# Patient Record
Sex: Female | Born: 1960 | Race: White | Hispanic: No | Marital: Married | Smoking: Never smoker
Health system: Southern US, Community
[De-identification: ages and names within clinical notes are randomized; demographics above are authoritative.]

## PROBLEM LIST (undated history)

## (undated) DIAGNOSIS — F988 Other specified behavioral and emotional disorders with onset usually occurring in childhood and adolescence: Secondary | ICD-10-CM

## (undated) DIAGNOSIS — K5790 Diverticulosis of intestine, part unspecified, without perforation or abscess without bleeding: Secondary | ICD-10-CM

## (undated) HISTORY — PX: COLONOSCOPY: SHX174

## (undated) HISTORY — DX: Diverticulosis of intestine, part unspecified, without perforation or abscess without bleeding: K57.90

## (undated) HISTORY — DX: Other specified behavioral and emotional disorders with onset usually occurring in childhood and adolescence: F98.8

## (undated) HISTORY — PX: POLYPECTOMY: SHX149

---
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
## 1979-10-06 HISTORY — PX: FEMORAL HERNIA REPAIR: SUR1179

## 2007-09-14 ENCOUNTER — Ambulatory Visit: Payer: Self-pay | Admitting: Family Medicine

## 2007-09-14 DIAGNOSIS — F988 Other specified behavioral and emotional disorders with onset usually occurring in childhood and adolescence: Secondary | ICD-10-CM | POA: Insufficient documentation

## 2007-09-15 ENCOUNTER — Encounter: Payer: Self-pay | Admitting: Family Medicine

## 2007-10-17 ENCOUNTER — Ambulatory Visit: Payer: Self-pay | Admitting: Family Medicine

## 2007-10-18 ENCOUNTER — Encounter: Payer: Self-pay | Admitting: Family Medicine

## 2007-10-18 LAB — CONVERTED CEMR LAB
Albumin: 4.8 g/dL (ref 3.5–5.2)
CO2: 20 meq/L (ref 19–32)
Calcium: 9.8 mg/dL (ref 8.4–10.5)
Chloride: 107 meq/L (ref 96–112)
Glucose, Bld: 88 mg/dL (ref 70–99)
Potassium: 5 meq/L (ref 3.5–5.3)
Sodium: 141 meq/L (ref 135–145)
Total Protein: 7.4 g/dL (ref 6.0–8.3)

## 2007-11-17 ENCOUNTER — Telehealth: Payer: Self-pay | Admitting: Family Medicine

## 2007-12-15 ENCOUNTER — Telehealth: Payer: Self-pay | Admitting: Family Medicine

## 2008-01-18 ENCOUNTER — Telehealth: Payer: Self-pay | Admitting: Family Medicine

## 2008-02-21 ENCOUNTER — Telehealth: Payer: Self-pay | Admitting: Family Medicine

## 2008-03-27 ENCOUNTER — Telehealth: Payer: Self-pay | Admitting: Family Medicine

## 2008-04-25 ENCOUNTER — Telehealth: Payer: Self-pay | Admitting: Family Medicine

## 2008-05-28 ENCOUNTER — Ambulatory Visit: Payer: Self-pay | Admitting: Family Medicine

## 2008-06-28 ENCOUNTER — Telehealth: Payer: Self-pay | Admitting: Family Medicine

## 2008-08-06 ENCOUNTER — Telehealth: Payer: Self-pay | Admitting: Family Medicine

## 2008-09-05 ENCOUNTER — Telehealth: Payer: Self-pay | Admitting: Family Medicine

## 2008-10-15 ENCOUNTER — Telehealth: Payer: Self-pay | Admitting: Family Medicine

## 2008-11-15 ENCOUNTER — Telehealth: Payer: Self-pay | Admitting: Family Medicine

## 2008-11-16 ENCOUNTER — Ambulatory Visit: Payer: Self-pay | Admitting: Family Medicine

## 2008-12-18 ENCOUNTER — Telehealth: Payer: Self-pay | Admitting: Family Medicine

## 2009-02-26 ENCOUNTER — Telehealth: Payer: Self-pay | Admitting: Family Medicine

## 2009-04-03 ENCOUNTER — Telehealth: Payer: Self-pay | Admitting: Family Medicine

## 2009-05-22 ENCOUNTER — Telehealth: Payer: Self-pay | Admitting: Family Medicine

## 2009-07-11 ENCOUNTER — Other Ambulatory Visit: Admission: RE | Admit: 2009-07-11 | Discharge: 2009-07-11 | Payer: Self-pay | Admitting: Family Medicine

## 2009-07-11 ENCOUNTER — Encounter: Payer: Self-pay | Admitting: Family Medicine

## 2009-07-11 ENCOUNTER — Ambulatory Visit: Payer: Self-pay | Admitting: Family Medicine

## 2009-08-13 ENCOUNTER — Telehealth: Payer: Self-pay | Admitting: Family Medicine

## 2009-09-10 ENCOUNTER — Telehealth: Payer: Self-pay | Admitting: Family Medicine

## 2009-10-24 ENCOUNTER — Telehealth: Payer: Self-pay | Admitting: Family Medicine

## 2010-01-02 ENCOUNTER — Telehealth: Payer: Self-pay | Admitting: Family Medicine

## 2010-02-10 ENCOUNTER — Telehealth: Payer: Self-pay | Admitting: Family Medicine

## 2010-04-24 ENCOUNTER — Telehealth: Payer: Self-pay | Admitting: Family Medicine

## 2010-05-17 ENCOUNTER — Encounter: Payer: Self-pay | Admitting: Family Medicine

## 2010-06-11 ENCOUNTER — Telehealth: Payer: Self-pay | Admitting: Family Medicine

## 2010-08-25 ENCOUNTER — Ambulatory Visit: Payer: Self-pay | Admitting: Family Medicine

## 2010-08-25 ENCOUNTER — Other Ambulatory Visit: Admission: RE | Admit: 2010-08-25 | Discharge: 2010-08-25 | Payer: Self-pay | Admitting: Family Medicine

## 2010-08-25 DIAGNOSIS — R635 Abnormal weight gain: Secondary | ICD-10-CM | POA: Insufficient documentation

## 2010-08-26 LAB — CONVERTED CEMR LAB
ALT: 22 units/L (ref 0–35)
AST: 25 units/L (ref 0–37)
CO2: 27 meq/L (ref 19–32)
Calcium: 9.4 mg/dL (ref 8.4–10.5)
Chloride: 106 meq/L (ref 96–112)
Cholesterol: 174 mg/dL (ref 0–200)
Sodium: 140 meq/L (ref 135–145)
TSH: 3.179 microintl units/mL (ref 0.350–4.500)
Total Bilirubin: 0.5 mg/dL (ref 0.3–1.2)
Total CHOL/HDL Ratio: 2.8
Total Protein: 6.5 g/dL (ref 6.0–8.3)
VLDL: 11 mg/dL (ref 0–40)

## 2010-08-27 LAB — CONVERTED CEMR LAB: Pap Smear: NEGATIVE

## 2010-09-22 ENCOUNTER — Telehealth: Payer: Self-pay | Admitting: Family Medicine

## 2010-10-28 ENCOUNTER — Telehealth: Payer: Self-pay | Admitting: Family Medicine

## 2010-11-02 LAB — CONVERTED CEMR LAB
AST: 17 units/L (ref 0–37)
Albumin: 4.5 g/dL (ref 3.5–5.2)
Alkaline Phosphatase: 48 units/L (ref 39–117)
LDL Cholesterol: 92 mg/dL (ref 0–99)
Potassium: 4.3 meq/L (ref 3.5–5.3)
Sodium: 142 meq/L (ref 135–145)
Total Bilirubin: 0.5 mg/dL (ref 0.3–1.2)
Total Protein: 6.9 g/dL (ref 6.0–8.3)
VLDL: 10 mg/dL (ref 0–40)

## 2010-11-06 NOTE — Progress Notes (Signed)
Summary: Adderall refill  Phone Note Refill Request   Refills Requested: Medication #1:  ADDERALL XR 30 MG XR24H-CAP 1 tab by mouth qAM  Medication #2:  ADDERALL 10 MG TABS Take 1 tablet by mouth once a day. Initial call taken by: Payton Spark CMA,  October 24, 2009 10:07 AM    Prescriptions: ADDERALL 10 MG TABS (AMPHETAMINE-DEXTROAMPHETAMINE) Take 1 tablet by mouth once a day  #30 x 0   Entered and Authorized by:   Seymour Bars DO   Signed by:   Seymour Bars DO on 10/24/2009   Method used:   Print then Give to Patient   RxID:   [id number] ADDERALL XR 30 MG XR24H-CAP (AMPHETAMINE-DEXTROAMPHETAMINE) 1 tab by mouth qAM  #30 x 0   Entered and Authorized by:   Seymour Bars DO   Signed by:   Seymour Bars DO on 10/24/2009   Method used:   Print then Give to Patient   RxID:   [id number]

## 2010-11-06 NOTE — Progress Notes (Signed)
Summary: Adderall refill  Phone Note Refill Request Message from:  Patient on September 22, 2010 3:05 PM  Refills Requested: Medication #1:  ADDERALL XR 30 MG XR24H-CAP 1 tab by mouth qAM   Supply Requested: 1 month  Medication #2:  ADDERALL 10 MG TABS Take 1 tablet by mouth once a day.   Supply Requested: 1 month  Method Requested: Pick up at Office Initial call taken by: Kathlene November LPN,  September 22, 2010 3:05 PM    Prescriptions: ADDERALL 10 MG TABS (AMPHETAMINE-DEXTROAMPHETAMINE) Take 1 tablet by mouth once a day  #30 x 0   Entered and Authorized by:   Seymour Bars DO   Signed by:   Seymour Bars DO on 09/22/2010   Method used:   Print then Give to Patient   RxID:   [id number] ADDERALL XR 30 MG XR24H-CAP (AMPHETAMINE-DEXTROAMPHETAMINE) 1 tab by mouth qAM  #30 x 0   Entered and Authorized by:   Seymour Bars DO   Signed by:   Seymour Bars DO on 09/22/2010   Method used:   Print then Give to Patient   RxID:   [id number]

## 2010-11-06 NOTE — Progress Notes (Signed)
Summary: Adderall refill  Phone Note Refill Request   Refills Requested: Medication #1:  ADDERALL XR 30 MG XR24H-CAP 1 tab by mouth qAM  Medication #2:  ADDERALL 10 MG TABS Take 1 tablet by mouth once a day. Initial call taken by: Payton Spark CMA,  April 24, 2010 10:22 AM    Prescriptions: ADDERALL 10 MG TABS (AMPHETAMINE-DEXTROAMPHETAMINE) Take 1 tablet by mouth once a day  #30 x 0   Entered and Authorized by:   Seymour Bars DO   Signed by:   Seymour Bars DO on 04/24/2010   Method used:   Print then Give to Patient   RxID:   [id number] ADDERALL XR 30 MG XR24H-CAP (AMPHETAMINE-DEXTROAMPHETAMINE) 1 tab by mouth qAM  #30 x 0   Entered and Authorized by:   Seymour Bars DO   Signed by:   Seymour Bars DO on 04/24/2010   Method used:   Print then Give to Patient   RxID:   [id number]

## 2010-11-06 NOTE — Progress Notes (Signed)
Summary: Adderall Refills  Phone Note Refill Request   Refills Requested: Medication #1:  ADDERALL XR 30 MG XR24H-CAP 1 tab by mouth qAM  Medication #2:  ADDERALL 10 MG TABS Take 1 tablet by mouth once a day. Initial call taken by: Payton Spark CMA,  June 11, 2010 11:34 AM  Follow-up for Phone Call        RFs done. Needs OV in OCT for f/u ADD. Follow-up by: Seymour Bars DO,  June 11, 2010 11:39 AM    Prescriptions: ADDERALL 10 MG TABS (AMPHETAMINE-DEXTROAMPHETAMINE) Take 1 tablet by mouth once a day  #30 x 0   Entered and Authorized by:   Seymour Bars DO   Signed by:   Seymour Bars DO on 06/11/2010   Method used:   Print then Give to Patient   RxID:   [id number] ADDERALL XR 30 MG XR24H-CAP (AMPHETAMINE-DEXTROAMPHETAMINE) 1 tab by mouth qAM  #30 x 0   Entered and Authorized by:   Seymour Bars DO   Signed by:   Seymour Bars DO on 06/11/2010   Method used:   Print then Give to Patient   RxID:   [id number]

## 2010-11-06 NOTE — Assessment & Plan Note (Signed)
Summary: CPE with pap   Vital Signs:  Patient profile:   50 year old female LMP:     04/04/2010 Height:      61.5 inches Weight:      137 pounds BMI:     25.56 Pulse rate:   67 / minute BP sitting:   110 / 60  (left arm) Cuff size:   regular  Vitals Entered By: Kathlene November LPN (August 25, 2010 10:59 AM) CC: CPE with pap LMP (date): 04/04/2010     Enter LMP: 04/04/2010 Last PAP Result NEGATIVE FOR INTRAEPITHELIAL LESIONS OR MALIGNANCY.   Primary Care Provider:  Seymour Bars DO  CC:  CPE with pap.  History of Present Illness: 50 yo WF presents for CPE with pap smear.  She is previously healthy and doing well on Adderall for her ADD.  She denies a fam hx of heart dz, colon cancer or breast cancer.  Her mammogram was updated in August.  She has not had a colonoscopy yet.  Denies CP, DOE.  Her tetanus shot was updated 2010 and she had her flu shot this year.    Due for fasting labs. Having periods q 6 mos.    Allergies: No Known Drug Allergies  Past History:  Past Medical History: Reviewed history from 07/11/2009 and no changes required. ADD Breeze.Bergeron  Past Surgical History: Reviewed history from 09/14/2007 and no changes required. femoral hernia LTCS x 2  Family History: Reviewed history from 09/14/2007 and no changes required. father healthy mother postmenopausal breast cancer sister healthy  Social History: Reviewed history from 07/11/2009 and no changes required. Accountant, with Law Degree  Married to Marina and has 2 sons, 1 stepson and 2 daughters. Never smoked. 2 ETOH/ month Condoms for birth control. 2 coffee/ day. No exercise.    Review of Systems  The patient denies anorexia, fever, weight loss, weight gain, vision loss, decreased hearing, hoarseness, chest pain, syncope, dyspnea on exertion, peripheral edema, prolonged cough, headaches, hemoptysis, abdominal pain, melena, hematochezia, severe indigestion/heartburn, hematuria, incontinence, genital  sores, muscle weakness, suspicious skin lesions, transient blindness, difficulty walking, depression, unusual weight change, abnormal bleeding, enlarged lymph nodes, angioedema, breast masses, and testicular masses.    Physical Exam  General:  alert, well-developed, well-nourished, and well-hydrated.   Head:  normocephalic and atraumatic.   Eyes:  pupils equal, pupils round, and pupils reactive to light.   Ears:  no external deformities.   Nose:  no nasal discharge.   Mouth:  good dentition and pharynx pink and moist.   Neck:  no masses.   Breasts:  No mass, nodules, thickening, tenderness, bulging, retraction, inflamation, nipple discharge or skin changes noted.   Lungs:  Normal respiratory effort, chest expands symmetrically. Lungs are clear to auscultation, no crackles or wheezes. Heart:  Normal rate and regular rhythm. S1 and S2 normal without gallop, murmur, click, rub or other extra sounds. Abdomen:  Bowel sounds positive,abdomen soft and non-tender without masses, organomegaly Genitalia:  Pelvic Exam:        External: normal female genitalia without lesions or masses        Vagina: normal without lesions or masses, mild grade 1+ bladder prolapse        Cervix: normal without lesions or masses        Adnexa: normal bimanual exam without masses or fullness        Uterus: normal by palpation        Pap smear: performed Pulses:  2+ radial pulses Extremities:  no LE edema Skin:  color normal.   Cervical Nodes:  No lymphadenopathy noted Psych:  good eye contact, not anxious appearing, and not depressed appearing.     Impression & Recommendations:  Problem # 1:  ROUTINE GYNECOLOGICAL EXAMINATION (ICD-V72.31) Thin prep pap done. Keeping healthy checklist for women reviewed.   BP at goal.  BMI 25 = normal. Check TSH with fasting labs for wt gain. Tdap done 2010. Mammogram done 05-2010. She will call when ready to set up her colonoscopy. RTC 1 yr. F/U pap results.  Complete  Medication List: 1)  Adderall Xr 30 Mg Xr24h-cap (Amphetamine-dextroamphetamine) .Marland Kitchen.. 1 tab by mouth qam 2)  Adderall 10 Mg Tabs (Amphetamine-dextroamphetamine) .... Take 1 tablet by mouth once a day  Other Orders: T-TSH [id number]) T-Lipid Profile [id number]) T-Comprehensive Metabolic Panel [id number])  Patient Instructions: 1)  Update fasting labs today. 2)  Will call you w/ results tomorrow. 3)  Will call you w/ pap results next wk. 4)  Call if any problems. 5)  Return for next PHYSICAL in 1 yr, sooner if needed for any changes in ADD meds. Prescriptions: ADDERALL 10 MG TABS (AMPHETAMINE-DEXTROAMPHETAMINE) Take 1 tablet by mouth once a day  #30 x 0   Entered and Authorized by:   Seymour Bars DO   Signed by:   Seymour Bars DO on 08/25/2010   Method used:   Print then Give to Patient   RxID:   [id number] ADDERALL XR 30 MG XR24H-CAP (AMPHETAMINE-DEXTROAMPHETAMINE) 1 tab by mouth qAM  #30 x 0   Entered and Authorized by:   Seymour Bars DO   Signed by:   Seymour Bars DO on 08/25/2010   Method used:   Print then Give to Patient   RxID:   [id number]    Orders Added: 1)  T-TSH [[id number]] 2)  T-Lipid Profile [[id number]] 3)  T-Comprehensive Metabolic Panel [[id number]] 4)  Est. Patient age 46-64 [phone]

## 2010-11-06 NOTE — Progress Notes (Signed)
Summary: Adderall refill  Phone Note Refill Request Message from:  Patient on January 02, 2010 12:35 PM  Refills Requested: Medication #1:  ADDERALL XR 30 MG XR24H-CAP 1 tab by mouth qAM  Medication #2:  ADDERALL 10 MG TABS Take 1 tablet by mouth once a day. Initial call taken by: Payton Spark CMA,  January 02, 2010 12:35 PM    Prescriptions: ADDERALL 10 MG TABS (AMPHETAMINE-DEXTROAMPHETAMINE) Take 1 tablet by mouth once a day  #30 x 0   Entered and Authorized by:   Seymour Bars DO   Signed by:   Seymour Bars DO on 01/02/2010   Method used:   Print then Give to Patient   RxID:   [id number] ADDERALL XR 30 MG XR24H-CAP (AMPHETAMINE-DEXTROAMPHETAMINE) 1 tab by mouth qAM  #30 x 0   Entered and Authorized by:   Seymour Bars DO   Signed by:   Seymour Bars DO on 01/02/2010   Method used:   Print then Give to Patient   RxID:   [id number]  needs OV for f/u ADD in APRIL.  Seymour Bars, D.O.

## 2010-11-06 NOTE — Miscellaneous (Signed)
Summary: mammogram normal  Clinical Lists Changes  Observations: Added new observation of MAMMRECACT: Screening mammogram in 1 year.    (05/15/2010 14:40) Added new observation of MAMMOGRAM: Location: [facility].  No specific mammographic evidence of malignancy.  Assessment: BIRADS 1.  (05/15/2010 14:40)      Mammogram  Procedure date:  05/15/2010  Findings:      Location: [facility].  No specific mammographic evidence of malignancy.  Assessment: BIRADS 1.   Comments:      Screening mammogram in 1 year.      Mammogram  Procedure date:  05/15/2010  Findings:      Location: [facility].  No specific mammographic evidence of malignancy.  Assessment: BIRADS 1.   Comments:      Screening mammogram in 1 year.

## 2010-11-06 NOTE — Progress Notes (Signed)
Summary: Adderall refills  Phone Note Refill Request   Refills Requested: Medication #1:  ADDERALL XR 30 MG XR24H-CAP 1 tab by mouth qAM  Medication #2:  ADDERALL 10 MG TABS Take 1 tablet by mouth once a day. Initial call taken by: Payton Spark CMA,  Feb 10, 2010 2:14 PM    Prescriptions: ADDERALL 10 MG TABS (AMPHETAMINE-DEXTROAMPHETAMINE) Take 1 tablet by mouth once a day  #30 x 0   Entered and Authorized by:   Seymour Bars DO   Signed by:   Seymour Bars DO on 02/10/2010   Method used:   Print then Give to Patient   RxID:   [id number] ADDERALL XR 30 MG XR24H-CAP (AMPHETAMINE-DEXTROAMPHETAMINE) 1 tab by mouth qAM  #30 x 0   Entered and Authorized by:   Seymour Bars DO   Signed by:   Seymour Bars DO on 02/10/2010   Method used:   Print then Give to Patient   RxID:   [id number]

## 2010-11-06 NOTE — Progress Notes (Addendum)
Summary: Adderall Refill  Phone Note Refill Request Call back at Home Phone [phone] Message from:  Patient on October 28, 2010 2:26 PM  Refills Requested: Medication #1:  ADDERALL XR 30 MG XR24H-CAP 1 tab by mouth qAM   Dosage confirmed as above?Dosage Confirmed   Supply Requested: 1 month  Medication #2:  ADDERALL 10 MG TABS Take 1 tablet by mouth once a day.   Dosage confirmed as above?Dosage Confirmed   Supply Requested: 1 month patient is requesting refill for self and daughter (see seperate note)   Method Requested: Pick up at Office Initial call taken by: Francee Piccolo CMA Duncan Dull),  October 28, 2010 2:26 PM    Prescriptions: ADDERALL 10 MG TABS (AMPHETAMINE-DEXTROAMPHETAMINE) Take 1 tablet by mouth once a day  #30 x 0   Entered and Authorized by:   Seymour Bars DO   Signed by:   Seymour Bars DO on 10/28/2010   Method used:   Print then Give to Patient   RxID:   [id number] ADDERALL XR 30 MG XR24H-CAP (AMPHETAMINE-DEXTROAMPHETAMINE) 1 tab by mouth qAM  #30 x 0   Entered and Authorized by:   Seymour Bars DO   Signed by:   Seymour Bars DO on 10/28/2010   Method used:   Print then Give to Patient   RxID:   [id number]   Appended Document: Adderall Refill notified rx ready to be picked up.

## 2010-12-03 ENCOUNTER — Telehealth: Payer: Self-pay | Admitting: Family Medicine

## 2010-12-11 NOTE — Progress Notes (Signed)
Summary: [facility]-Adderall Refill  Phone Note Refill Request Call back at Home Phone [phone] Message from:  Patient  Refills Requested: Medication #1:  ADDERALL XR 30 MG XR24H-CAP 1 tab by mouth qAM   Dosage confirmed as above?Dosage Confirmed   Supply Requested: 1 month   Last Refilled: 10/28/2010  Medication #2:  ADDERALL 10 MG TABS Take 1 tablet by mouth once a day.   Dosage confirmed as above?Dosage Confirmed   Supply Requested: 1 month   Last Refilled: 10/28/2010 last seen 08/25/10   Method Requested: Pick up at Office Next Appointment Scheduled: none Initial call taken by: Francee Piccolo CMA Duncan Dull),  December 03, 2010 8:18 AM    Prescriptions: ADDERALL 10 MG TABS (AMPHETAMINE-DEXTROAMPHETAMINE) Take 1 tablet by mouth once a day  #30 x 0   Entered and Authorized by:   Seymour Bars DO   Signed by:   Seymour Bars DO on 12/03/2010   Method used:   Print then Give to Patient   RxID:   [id number] ADDERALL XR 30 MG XR24H-CAP (AMPHETAMINE-DEXTROAMPHETAMINE) 1 tab by mouth qAM  #30 x 0   Entered and Authorized by:   Seymour Bars DO   Signed by:   Seymour Bars DO on 12/03/2010   Method used:   Print then Give to Patient   RxID:   [id number]

## 2011-01-14 ENCOUNTER — Other Ambulatory Visit: Payer: Self-pay | Admitting: *Deleted

## 2011-01-14 MED ORDER — AMPHETAMINE-DEXTROAMPHET ER 30 MG PO CP24: 30.0000 mg | ORAL_CAPSULE | ORAL | Status: DC

## 2011-01-14 MED ORDER — AMPHETAMINE-DEXTROAMPHETAMINE 10 MG PO TABS: 10.0000 mg | ORAL_TABLET | Freq: Every day | ORAL | Status: DC

## 2011-01-16 ENCOUNTER — Telehealth: Payer: Self-pay | Admitting: Family Medicine

## 2011-01-16 NOTE — Telephone Encounter (Signed)
Patient called triage line YESTERDAY 01-15-11 and wants to know if her Rx. Refill is ready? Call patient at [phone]. Thanks, [employer]

## 2011-03-04 ENCOUNTER — Other Ambulatory Visit: Payer: Self-pay | Admitting: Family Medicine

## 2011-03-04 NOTE — Telephone Encounter (Signed)
Pt called and needs refill of her adderall 10 mg daily. Plan:  Routed to Dr. Cathey Endow for authorization Jarvis Newcomer, LPN [facility]

## 2011-03-05 MED ORDER — AMPHETAMINE-DEXTROAMPHETAMINE 10 MG PO TABS: 10.0000 mg | ORAL_TABLET | Freq: Every day | ORAL | Status: DC

## 2011-03-05 NOTE — Telephone Encounter (Signed)
If she needs this before Monday, pls print off and have Dr Linford Arnold sign this for me.  Thanks.

## 2011-03-05 NOTE — Telephone Encounter (Signed)
Addended by: Ellsworth Lennox on: 03/05/2011 04:13 PM   Modules accepted: Orders

## 2011-03-10 ENCOUNTER — Other Ambulatory Visit: Payer: Self-pay | Admitting: *Deleted

## 2011-03-10 MED ORDER — AMPHETAMINE-DEXTROAMPHET ER 30 MG PO CP24: 30.0000 mg | ORAL_CAPSULE | ORAL | Status: DC

## 2011-04-13 ENCOUNTER — Other Ambulatory Visit: Payer: Self-pay | Admitting: Family Medicine

## 2011-04-13 MED ORDER — AMPHETAMINE-DEXTROAMPHET ER 30 MG PO CP24: 30.0000 mg | ORAL_CAPSULE | ORAL | Status: DC

## 2011-04-13 MED ORDER — AMPHETAMINE-DEXTROAMPHETAMINE 10 MG PO TABS: 10.0000 mg | ORAL_TABLET | Freq: Every day | ORAL | Status: DC

## 2011-04-13 NOTE — Telephone Encounter (Signed)
Pt called for refills of her adderall XR 30 mg daily and her adderall 10 mg daily.  Pt UTD on CPE/PAP.  Last CPE/PAP 08-25-10. However, pt has not been seen for her adderall followup which is due every 6 months.   Plan:  Refilled both adderall scripts this time and pt informed needs to sched adderall follow up to eval B/P while on adderall medications.  Offered to schedule the appt while pt on the line but she said she would call back to schedule.  Pt medications after today can only be refilled after a adderall follow up is sched and satisfied.  Pt aware of these recommendations by Dr. Cathey Endow.  Jarvis Newcomer, LPN [place]

## 2011-05-10 ENCOUNTER — Encounter: Payer: Self-pay | Admitting: Family Medicine

## 2011-05-12 ENCOUNTER — Ambulatory Visit (INDEPENDENT_AMBULATORY_CARE_PROVIDER_SITE_OTHER): Payer: BC Managed Care – PPO | Admitting: Family Medicine

## 2011-05-12 ENCOUNTER — Encounter: Payer: Self-pay | Admitting: Family Medicine

## 2011-05-12 DIAGNOSIS — F988 Other specified behavioral and emotional disorders with onset usually occurring in childhood and adolescence: Secondary | ICD-10-CM

## 2011-05-12 DIAGNOSIS — R635 Abnormal weight gain: Secondary | ICD-10-CM

## 2011-05-12 MED ORDER — AMPHETAMINE-DEXTROAMPHET ER 30 MG PO CP24: 30.0000 mg | ORAL_CAPSULE | ORAL | Status: DC

## 2011-05-12 MED ORDER — AMPHETAMINE-DEXTROAMPHETAMINE 10 MG PO TABS: 10.0000 mg | ORAL_TABLET | Freq: Every day | ORAL | Status: DC

## 2011-05-12 NOTE — Assessment & Plan Note (Signed)
Doing well on Adderall, using 30 mg XR in the AM on work days + 10 mg in the afternoon on longer work days.  Denies adverse SEs and BP/ HR is normal.  RFs done today.  CPE in 6 mos.

## 2011-05-12 NOTE — Progress Notes (Signed)
  Subjective:    Patient ID: Heather Sullivan, female    DOB: 1961-02-26, 50 y.o.   MRN: [id number]  HPI  50 yo WF presents for f/u visit for ADD.  She is still going thru Perimenopause and is unhappy w/ her weight, going up slowly.  She feels like the Adderall XR is really helping.  Denies insomnia.  Only using 10 mg dose on longer work days.  Denies palpitations or tremor.    BP 119/69  Pulse 73  Ht 5\' 2"  (1.575 m)  Wt 140 lb (63.504 kg)  BMI 25.61 kg/m2  LMP 04/11/2011     Review of Systems  Respiratory: Negative for shortness of breath.   Cardiovascular: Negative for chest pain and palpitations.  Neurological: Negative for tremors.  Psychiatric/Behavioral: Negative for sleep disturbance, dysphoric mood and decreased concentration. The patient is not nervous/anxious.        Objective:   Physical Exam  Constitutional: She appears well-developed and well-nourished.  HENT:  Mouth/Throat: Oropharynx is clear and moist.  Neck: No thyromegaly present.  Cardiovascular: Normal rate, regular rhythm and normal heart sounds.   No murmur heard. Pulmonary/Chest: Effort normal and breath sounds normal.  Skin: Skin is warm and dry.  Psychiatric: She has a normal mood and affect.          Assessment & Plan:

## 2011-05-12 NOTE — Patient Instructions (Signed)
BP looks great.  Stay on Adderall at current dose.  Work on a healthy diet - low sugar/ low carb/ higher in lean proteins and lots of veggies.  Drink plenty of water. Aim for an hour of exercise (weights + cardio) 4-5 days/ wk.  Call [facility] if you are interested in bioidentical hormones.  Return for a PHYSICAL in 6 mos.

## 2011-05-12 NOTE — Assessment & Plan Note (Signed)
Slow weight gain thru perimenopause.  She is trying to eat healthy and exercise.  We discussed more of a low carb/ higher protein diet with lots of veggies, water intakke and adding resistance training to cardio with an aim of 1 hr 4-5 days/ wk.  If not seeing results in 4 wks, she is welcome to do hormone testing at [facility].  Card given today.

## 2011-05-29 ENCOUNTER — Other Ambulatory Visit: Payer: Self-pay | Admitting: *Deleted

## 2011-05-29 MED ORDER — AMPHETAMINE-DEXTROAMPHET ER 30 MG PO CP24: 30.0000 mg | ORAL_CAPSULE | ORAL | Status: DC

## 2011-05-29 MED ORDER — AMPHETAMINE-DEXTROAMPHETAMINE 10 MG PO TABS: 10.0000 mg | ORAL_TABLET | Freq: Every day | ORAL | Status: DC

## 2011-06-01 ENCOUNTER — Other Ambulatory Visit: Payer: Self-pay | Admitting: *Deleted

## 2011-06-01 MED ORDER — AMPHETAMINE-DEXTROAMPHET ER 30 MG PO CP24: 30.0000 mg | ORAL_CAPSULE | ORAL | Status: DC

## 2011-06-01 MED ORDER — AMPHETAMINE-DEXTROAMPHETAMINE 10 MG PO TABS: 10.0000 mg | ORAL_TABLET | Freq: Every day | ORAL | Status: DC

## 2011-06-01 NOTE — Telephone Encounter (Signed)
Refill done on 05/29/2011 was not correct and was not picked up by Pt. I will shred Rx from 05/29/2011

## 2011-06-10 ENCOUNTER — Encounter: Payer: Self-pay | Admitting: Family Medicine

## 2011-10-21 ENCOUNTER — Other Ambulatory Visit: Payer: Self-pay | Admitting: *Deleted

## 2011-10-21 MED ORDER — AMPHETAMINE-DEXTROAMPHET ER 30 MG PO CP24: 30.0000 mg | ORAL_CAPSULE | ORAL | Status: DC

## 2011-10-21 MED ORDER — AMPHETAMINE-DEXTROAMPHETAMINE 10 MG PO TABS: 10.0000 mg | ORAL_TABLET | Freq: Every day | ORAL | Status: DC

## 2011-11-18 ENCOUNTER — Encounter: Payer: Self-pay | Admitting: *Deleted

## 2011-11-23 ENCOUNTER — Other Ambulatory Visit (HOSPITAL_COMMUNITY)
Admission: RE | Admit: 2011-11-23 | Discharge: 2011-11-23 | Disposition: A | Discharge status: Home / Self Care | Payer: BC Managed Care – PPO | Source: Ambulatory Visit | Attending: Family Medicine | Admitting: Family Medicine

## 2011-11-23 ENCOUNTER — Encounter: Payer: Self-pay | Admitting: Physician Assistant

## 2011-11-23 ENCOUNTER — Ambulatory Visit (INDEPENDENT_AMBULATORY_CARE_PROVIDER_SITE_OTHER): Payer: BC Managed Care – PPO | Admitting: Physician Assistant

## 2011-11-23 VITALS — BP 118/82 | HR 72 | Ht 62.0 in | Wt 145.0 lb

## 2011-11-23 DIAGNOSIS — Z01419 Encounter for gynecological examination (general) (routine) without abnormal findings: Secondary | ICD-10-CM

## 2011-11-23 DIAGNOSIS — Z13220 Encounter for screening for lipoid disorders: Secondary | ICD-10-CM

## 2011-11-23 DIAGNOSIS — D229 Melanocytic nevi, unspecified: Secondary | ICD-10-CM

## 2011-11-23 DIAGNOSIS — Z131 Encounter for screening for diabetes mellitus: Secondary | ICD-10-CM

## 2011-11-23 DIAGNOSIS — Z1159 Encounter for screening for other viral diseases: Secondary | ICD-10-CM | POA: Insufficient documentation

## 2011-11-23 DIAGNOSIS — Z1211 Encounter for screening for malignant neoplasm of colon: Secondary | ICD-10-CM

## 2011-11-23 DIAGNOSIS — Z124 Encounter for screening for malignant neoplasm of cervix: Secondary | ICD-10-CM

## 2011-11-23 DIAGNOSIS — R635 Abnormal weight gain: Secondary | ICD-10-CM

## 2011-11-23 NOTE — Progress Notes (Signed)
Subjective:    Patient ID: Heather Sullivan, female    DOB: [birth year], 51 y.o.   MRN: [id number]  HPI    Review of Systems     Objective:   Physical Exam        Assessment & Plan:      Subjective:     Heather Sullivan is a 51 y.o. female and is here for a comprehensive physical exam. The patient reports concerns about weight gain..She has continue to gain weight about 10 lbs over the last 6 months. She denies increase in appetite. She works out 3-4 times a week with cardio for about 45 minutes. She has a balanced diet with a regular day of a balanced diet.   She does report a lot of sun exposure. She had a uncle that passed away from melanoma.  History   Social History  . Marital Status: Married    Spouse Name: N/A    Number of Children: N/A  . Years of Education: N/A   Occupational History  . Not on file.   Social History Main Topics  . Smoking status: Never Smoker   . Smokeless tobacco: Not on file  . Alcohol Use: 1.0 oz/week    2 drink(s) per week     per week  . Drug Use:   . Sexually Active: Yes    Birth Control/ Protection: Condom   Other Topics Concern  . Not on file   Social History Narrative  . No narrative on file   Health Maintenance  Topic Date Due  . Colonoscopy  01/19/2010  . Influenza Vaccine  07/05/2012  . Mammogram  05/24/2013  . Pap Smear  11/23/2014  . Tetanus/tdap  07/12/2019    The following portions of the patient's history were reviewed and updated as appropriate: allergies, current medications, past family history, past medical history, past social history, past surgical history and problem list.  Review of Systems Pertinent items are noted in HPI.   Objective:    BP 133/91  Pulse 72  Ht 5\' 2"  (1.575 m)  Wt 145 lb (65.772 kg)  BMI 26.52 kg/m2  SpO2 97%  LMP 04/11/2011 General appearance: alert, cooperative and appears stated age Head: Normocephalic, without obvious abnormality, atraumatic Eyes: conjunctivae/corneas  clear. PERRL, EOM's intact. Fundi benign. Ears: normal TM's and external ear canals both ears Nose: Nares normal. Septum midline. Mucosa normal. No drainage or sinus tenderness. Throat: lips, mucosa, and tongue normal; teeth and gums normal Neck: no adenopathy, no carotid bruit, no JVD, supple, symmetrical, trachea midline and thyroid not enlarged, symmetric, no tenderness/mass/nodules Back: symmetric, no curvature. ROM normal. No CVA tenderness. Lungs: clear to auscultation bilaterally Breasts: normal appearance, no masses or tenderness, Inspection negative, Normal to palpation without dominant masses Heart: regular rate and rhythm, S1, S2 normal, no murmur, click, rub or gallop and normal apical impulse Abdomen: soft, non-tender; bowel sounds normal; no masses,  no organomegaly Pelvic: cervix normal in appearance, external genitalia normal, no adnexal masses or tenderness, no cervical motion tenderness, rectovaginal septum normal, uterus normal size, shape, and consistency and vagina normal without discharge Extremities: extremities normal, atraumatic, no cyanosis or edema Pulses: 2+ and symmetric Skin: Patient has freckles over entire body. 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 Lymph nodes: Cervical, supraclavicular, and axillary nodes normal. Neurologic: Alert and oriented X 3, normal strength and tone. Normal symmetric reflexes. Normal coordination and gait    Assessment:    Healthy  female exam.  Atypical Nevus Need for Colonoscopy Weight gain     Plan:  Atypical Nevus-Patient needs to make appointment to get removed for biopsy.  Referred for Colonoscopy.  Weight gain-Order TSH. Approved usage of Amberen. Let her know there has been to statistical evidence that it works she can try it. Best way to lose weight is diet and exercise. She could consider more cardio. We talked about referral to a nutritionist or Dr. Cathey Endow for more weight loss  ideas.  CMP, Lipid panel order for screening purposes.   See After Visit Summary for Counseling Recommendations

## 2011-11-23 NOTE — Patient Instructions (Addendum)
Can start using Amberen. Cal office if want referral to Dr. Cathey Endow weight loss or nutritionist. Make sure you are getting adequate calcium daily at 500mg  twice a day or 4 servings of dairy. Get lab work and will call with results. Make appointment to remove mole off lower back.

## 2011-11-26 ENCOUNTER — Encounter: Payer: Self-pay | Admitting: Internal Medicine

## 2011-12-04 ENCOUNTER — Ambulatory Visit: Payer: BC Managed Care – PPO | Admitting: Physician Assistant

## 2011-12-22 ENCOUNTER — Encounter: Payer: Self-pay | Admitting: Physician Assistant

## 2011-12-22 ENCOUNTER — Ambulatory Visit (INDEPENDENT_AMBULATORY_CARE_PROVIDER_SITE_OTHER): Payer: BC Managed Care – PPO | Admitting: Physician Assistant

## 2011-12-22 ENCOUNTER — Other Ambulatory Visit: Payer: Self-pay | Admitting: *Deleted

## 2011-12-22 VITALS — BP 128/87 | HR 75 | Wt 145.0 lb

## 2011-12-22 DIAGNOSIS — F988 Other specified behavioral and emotional disorders with onset usually occurring in childhood and adolescence: Secondary | ICD-10-CM

## 2011-12-22 DIAGNOSIS — D225 Melanocytic nevi of trunk: Secondary | ICD-10-CM

## 2011-12-22 DIAGNOSIS — D235 Other benign neoplasm of skin of trunk: Secondary | ICD-10-CM

## 2011-12-22 MED ORDER — AMPHETAMINE-DEXTROAMPHET ER 30 MG PO CP24: 30.0000 mg | ORAL_CAPSULE | ORAL | Status: DC

## 2011-12-22 MED ORDER — AMPHETAMINE-DEXTROAMPHETAMINE 10 MG PO TABS: 10.0000 mg | ORAL_TABLET | Freq: Every day | ORAL | Status: DC

## 2011-12-22 NOTE — Patient Instructions (Signed)
Plan: 1. Instructed to keep the wound dry and covered for 24-48h and clean thereafter. 2. Warning signs of infection are pus or blood coming from site or bad smell. If you have this call office.  3. Recommended that the patient use OTC acetaminophen as needed for pain.  Will call with results.

## 2011-12-22 NOTE — Progress Notes (Signed)
  Subjective:    Patient ID: Heather Sullivan, female    DOB: 25-Jan-1961, 51 y.o.   MRN: [id number]  HPI Patient is here to have mole with irregular borders and dark color taken off and sent for biopsy.   She is doing well on her adderall and wants refilled today. She denies any chest pains or palpitations.    Review of Systems     Objective:   Physical Exam  Constitutional: She appears well-developed and well-nourished.  HENT:  Head: Normocephalic and atraumatic.  Cardiovascular: Normal rate, regular rhythm and normal heart sounds.   Pulmonary/Chest: Effort normal and breath sounds normal. She has no wheezes.  Skin:       3mm mole with irregular borders and dark color on middle of lower back.   Psychiatric: She has a normal mood and affect. Her behavior is normal.          Assessment & Plan:  Shave Biopsy Procedure Note2  Pre-operative Diagnosis: Dysplastic nevi  Post-operative Diagnosis: same  Locations:lower middle lumbar region   Indications: concerns of skin cancer  Anesthesia: Lidocaine 1% with epinephrine without added sodium bicarbonate  Procedure Details  History of allergy to iodine: no  Patient informed of the risks (including bleeding and infection) and benefits of the  procedure and Verbal informed consent obtained.  The lesion and surrounding area were given a sterile prep using betadyne and draped in the usual sterile fashion. A scalpel was used to shave an area of skin approximately 8mm by 8cm.  Hemostasis achieved with alumuninum chloride. Antibiotic ointment and a sterile dressing applied.  The specimen was sent for pathologic examination. The patient tolerated the procedure well.  EBL: .1 ml  Findings: dysplasctic nevus  Condition: Stable  Complications: none.  Plan: 1. Instructed to keep the wound dry and covered for 24-48h and clean thereafter. 2. Warning signs of infection were reviewed.   3. Recommended that the patient use OTC  acetaminophen as needed for pain.   Refilled Adderall for 90 days.

## 2011-12-24 ENCOUNTER — Other Ambulatory Visit: Payer: BC Managed Care – PPO | Admitting: Internal Medicine

## 2011-12-29 ENCOUNTER — Telehealth: Payer: Self-pay | Admitting: Physician Assistant

## 2011-12-29 NOTE — Telephone Encounter (Signed)
Pt is returning your call. States she can be reached at [phone].

## 2011-12-29 NOTE — Telephone Encounter (Signed)
Biopsy results were discussed with patient. Patient will get her own appointment with dermatology to follow her moles.

## 2011-12-29 NOTE — Telephone Encounter (Signed)
Called patient and left msg to talk about biopsy results.

## 2011-12-30 ENCOUNTER — Encounter: Payer: Self-pay | Admitting: *Deleted

## 2012-01-15 ENCOUNTER — Ambulatory Visit: Payer: BC Managed Care – PPO

## 2012-01-15 ENCOUNTER — Ambulatory Visit (INDEPENDENT_AMBULATORY_CARE_PROVIDER_SITE_OTHER): Payer: BC Managed Care – PPO | Admitting: Family Medicine

## 2012-01-15 VITALS — BP 117/76 | HR 69 | Temp 98.6°F | Resp 16 | Ht 62.25 in | Wt 143.8 lb

## 2012-01-15 DIAGNOSIS — M25519 Pain in unspecified shoulder: Secondary | ICD-10-CM

## 2012-01-15 DIAGNOSIS — M79601 Pain in right arm: Secondary | ICD-10-CM

## 2012-01-15 DIAGNOSIS — M79609 Pain in unspecified limb: Secondary | ICD-10-CM

## 2012-01-15 MED ORDER — METHYLPREDNISOLONE 4 MG PO KIT: PACK | ORAL | Status: DC

## 2012-01-15 MED ORDER — CYCLOBENZAPRINE HCL 5 MG PO TABS: ORAL_TABLET | ORAL | Status: DC

## 2012-01-15 NOTE — Patient Instructions (Signed)
Cervical Radiculopathy Cervical radiculopathy happens when a nerve in the neck is pinched or bruised by a slipped (herniated) disk or by arthritic changes in the bones of the cervical spine. This can occur due to an injury or as part of the normal aging process. Pressure on the cervical nerves can cause pain or numbness that runs from your neck all the way down into your arm and fingers. CAUSES  There are many possible causes, including:  Injury.   Muscle tightness in the neck from overuse.   Swollen, painful joints (arthritis).   Breakdown or degeneration in the bones and joints of the spine (spondylosis) due to aging.   Bone spurs that may develop near the cervical nerves.  SYMPTOMS  Symptoms include pain, weakness, or numbness in the affected arm and hand. Pain can be severe or irritating. Symptoms may be worse when extending or turning the neck. DIAGNOSIS  Your caregiver will ask about your symptoms and do a physical exam. He or she may test your strength and reflexes. X-rays, CT scans, and MRI scans may be needed in cases of injury or if the symptoms do not go away after a period of time. Electromyography (EMG) or nerve conduction testing may be done to study how your nerves and muscles are working. TREATMENT  Your caregiver may recommend certain exercises to help relieve your symptoms. Cervical radiculopathy can, and often does, get better with time and treatment. If your problems continue, treatment options may include:  Wearing a soft collar for short periods of time.   Physical therapy to strengthen the neck muscles.   Medicines, such as nonsteroidal anti-inflammatory drugs (NSAIDs), oral corticosteroids, or spinal injections.   Surgery. Different types of surgery may be done depending on the cause of your problems.  HOME CARE INSTRUCTIONS   Put ice on the affected area.   Put ice in a plastic bag.   Place a towel between your skin and the bag.   Leave the ice on for 15  to 20 minutes, 3 to 4 times a day or as directed by your caregiver.   Use a flat pillow when you sleep.   Only take over-the-counter or prescription medicines for pain, discomfort, or fever as directed by your caregiver.   If physical therapy was prescribed, follow your caregiver's directions.   If a soft collar was prescribed, use it as directed.  SEEK IMMEDIATE MEDICAL CARE IF:   Your pain gets much worse and cannot be controlled with medicines.   You have weakness or numbness in your hand, arm, face, or leg.   You have a high fever or a stiff, rigid neck.   You lose bowel or bladder control (incontinence).   You have trouble with walking, balance, or speaking.  MAKE SURE YOU:   Understand these instructions.   Will watch your condition.   Will get help right away if you are not doing well or get worse.  Document Released: 06/16/2001 Document Revised: 09/10/2011 Document Reviewed: 05/05/2011 [employer] Patient Information 2012 [employer]. 

## 2012-01-15 NOTE — Progress Notes (Signed)
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  NKI.  Pain worsens as the day progresses.  No numbness or motor impairment.  No weakness.  No h/o this happening before.  Needs to raise arm at night to get some relief. No neck pain No forearm pain  O:  NAD, alert and cooperative Neck FROM Arm: FROM Reflexes:  Normal BJ and TJ's symmetrical Normal arm strength and muscle mass Normal arm sensation Neck:  Nontender.  [facility] reading (PRIMARY) by  Dr. Milus Glazier C-[email or web address]  A:  C5 radiculopathy on the right  P:  Medrol dospak and flexeril at hs

## 2012-02-26 ENCOUNTER — Ambulatory Visit (INDEPENDENT_AMBULATORY_CARE_PROVIDER_SITE_OTHER): Payer: BC Managed Care – PPO | Admitting: Physician Assistant

## 2012-02-26 ENCOUNTER — Encounter: Payer: Self-pay | Admitting: Physician Assistant

## 2012-02-26 VITALS — BP 111/70 | HR 71 | Ht 62.25 in | Wt 143.0 lb

## 2012-02-26 DIAGNOSIS — Z0189 Encounter for other specified special examinations: Secondary | ICD-10-CM

## 2012-02-26 DIAGNOSIS — M25511 Pain in right shoulder: Secondary | ICD-10-CM

## 2012-02-26 DIAGNOSIS — R635 Abnormal weight gain: Secondary | ICD-10-CM

## 2012-02-26 DIAGNOSIS — M5412 Radiculopathy, cervical region: Secondary | ICD-10-CM

## 2012-02-26 DIAGNOSIS — R200 Anesthesia of skin: Secondary | ICD-10-CM

## 2012-02-26 LAB — CBC WITH DIFFERENTIAL/PLATELET
Basophils Absolute: 0.1 10*3/uL (ref 0.0–0.1)
Lymphocytes Relative: 40 % (ref 12–46)
Neutro Abs: 3.1 10*3/uL (ref 1.7–7.7)
Platelets: 323 10*3/uL (ref 150–400)
RDW: 12.7 % (ref 11.5–15.5)
WBC: 6.5 10*3/uL (ref 4.0–10.5)

## 2012-02-26 LAB — COMPLETE METABOLIC PANEL WITH GFR
ALT: 13 U/L (ref 0–35)
AST: 14 U/L (ref 0–37)
Albumin: 4.5 g/dL (ref 3.5–5.2)
Alkaline Phosphatase: 51 U/L (ref 39–117)
Calcium: 9.8 mg/dL (ref 8.4–10.5)
Chloride: 108 mEq/L (ref 96–112)
Potassium: 4.9 mEq/L (ref 3.5–5.3)
Sodium: 145 mEq/L (ref 135–145)
Total Protein: 6.6 g/dL (ref 6.0–8.3)

## 2012-02-26 LAB — LIPID PANEL
Cholesterol: 178 mg/dL (ref 0–200)
LDL Cholesterol: 108 mg/dL — ABNORMAL HIGH (ref 0–99)
Total CHOL/HDL Ratio: 3.5 Ratio
VLDL: 19 mg/dL (ref 0–40)

## 2012-02-26 LAB — TSH: TSH: 2.431 u[IU]/mL (ref 0.350–4.500)

## 2012-02-26 MED ORDER — METHYLPREDNISOLONE 4 MG PO KIT: PACK | ORAL | Status: AC

## 2012-02-26 MED ORDER — PREDNISONE (PAK) 10 MG PO TABS: 10.0000 mg | ORAL_TABLET | Freq: Every day | ORAL | Status: DC

## 2012-02-26 MED ORDER — MELOXICAM 7.5 MG PO TABS: 7.5000 mg | ORAL_TABLET | Freq: Every day | ORAL | Status: DC

## 2012-02-26 NOTE — Progress Notes (Addendum)
  Subjective:    Patient ID: Heather Sullivan, female    DOB: 1961-08-10, 51 y.o.   MRN: [id number]  HPI Patient presents to clinic with 2 months of neck and right shoulder and arm pain with numbness and tingling down into thumb and first finger. She denies any trauma or injury to neck. She has never had this until 2 months ago. She went to Urgent care 1 month ago and was given steroid and muscle relaxer. She did feel better for about 2 weeks but then the pain came back. It is hard for her to sleep at night. It feels better when arm is raised above head and worse when having to hold anything. Numbness is bad in the tricep area. She takes advil 600mg  every 4 hours and it does help but she doesn't want to have to keep taking this much medication.   Never got labs drawn for CPE will give handout to get today.    Review of Systems     Objective:   Physical Exam  Constitutional: She is oriented to person, place, and time. She appears well-developed and well-nourished.  HENT:  Head: Normocephalic and atraumatic.  Cardiovascular: Normal rate, regular rhythm and normal heart sounds.   Pulmonary/Chest: Effort normal and breath sounds normal.  Musculoskeletal:       Normal ROM of both arms and neck. NO tenderness to palpation of right arm, hand, or neck. Neg Drop arm test. Strength of right arm, neck, and hand 5/5. Antecubital reflex 2+.   Neurological: She is alert and oriented to person, place, and time.  Skin: Skin is warm and dry.  Psychiatric: She has a normal mood and affect. Her behavior is normal.          Assessment & Plan:  Cervical radiculopathy/numbness and tingling of right arm/right shoulder- Continue flexeril can take up to 10mg  at night. Consider ice on affect region of neck. Will give medrol dose pak and refer to PT for 6 weeks. Stop Advil. Mobic 7.5 mg daily given.Call if not improving in next 3-6 weeks. Follow up in 2 months.    Labs given to get drawn today.

## 2012-02-26 NOTE — Patient Instructions (Addendum)
Try therapeutic pillow at night. Can take 2 of the Flexeril(muscle relaxer) at night and see if that helps. Ice on affect areas. Start Mobic 7.5mg  daily. Will give a steroid taper(instructions on bottle) and call for Physical therapy for 6 weeks. Speak with Victorino Dike. Call if not improving in the next 3-4 weeks.   Cervical Radiculopathy Cervical radiculopathy happens when a nerve in the neck is pinched or bruised by a slipped (herniated) disk or by arthritic changes in the bones of the cervical spine. This can occur due to an injury or as part of the normal aging process. Pressure on the cervical nerves can cause pain or numbness that runs from your neck all the way down into your arm and fingers. CAUSES  There are many possible causes, including:  Injury.   Muscle tightness in the neck from overuse.   Swollen, painful joints (arthritis).   Breakdown or degeneration in the bones and joints of the spine (spondylosis) due to aging.   Bone spurs that may develop near the cervical nerves.  SYMPTOMS  Symptoms include pain, weakness, or numbness in the affected arm and hand. Pain can be severe or irritating. Symptoms may be worse when extending or turning the neck. DIAGNOSIS  Your caregiver will ask about your symptoms and do a physical exam. He or she may test your strength and reflexes. X-rays, CT scans, and MRI scans may be needed in cases of injury or if the symptoms do not go away after a period of time. Electromyography (EMG) or nerve conduction testing may be done to study how your nerves and muscles are working. TREATMENT  Your caregiver may recommend certain exercises to help relieve your symptoms. Cervical radiculopathy can, and often does, get better with time and treatment. If your problems continue, treatment options may include:  Wearing a soft collar for short periods of time.   Physical therapy to strengthen the neck muscles.   Medicines, such as nonsteroidal anti-inflammatory  drugs (NSAIDs), oral corticosteroids, or spinal injections.   Surgery. Different types of surgery may be done depending on the cause of your problems.  HOME CARE INSTRUCTIONS   Put ice on the affected area.   Put ice in a plastic bag.   Place a towel between your skin and the bag.   Leave the ice on for 15 to 20 minutes, 3 to 4 times a day or as directed by your caregiver.   Use a flat pillow when you sleep.   Only take over-the-counter or prescription medicines for pain, discomfort, or fever as directed by your caregiver.   If physical therapy was prescribed, follow your caregiver's directions.   If a soft collar was prescribed, use it as directed.  SEEK IMMEDIATE MEDICAL CARE IF:   Your pain gets much worse and cannot be controlled with medicines.   You have weakness or numbness in your hand, arm, face, or leg.   You have a high fever or a stiff, rigid neck.   You lose bowel or bladder control (incontinence).   You have trouble with walking, balance, or speaking.  MAKE SURE YOU:   Understand these instructions.   Will watch your condition.   Will get help right away if you are not doing well or get worse.  Document Released: 06/16/2001 Document Revised: 09/10/2011 Document Reviewed: 05/05/2011 [facility] [facility] Patient Information 2012 [place], [place].

## 2012-04-26 IMAGING — CR DG CERVICAL SPINE COMPLETE 4+V
5 series · 5 of 5 positions shown · non-contrast
Comparison: None.

CLINICAL DATA: Right-sided neck and shoulder pain which radiates to
right upper extremity

CERVICAL SPINE - COMPLETE 4+ VIEW

[lpo]
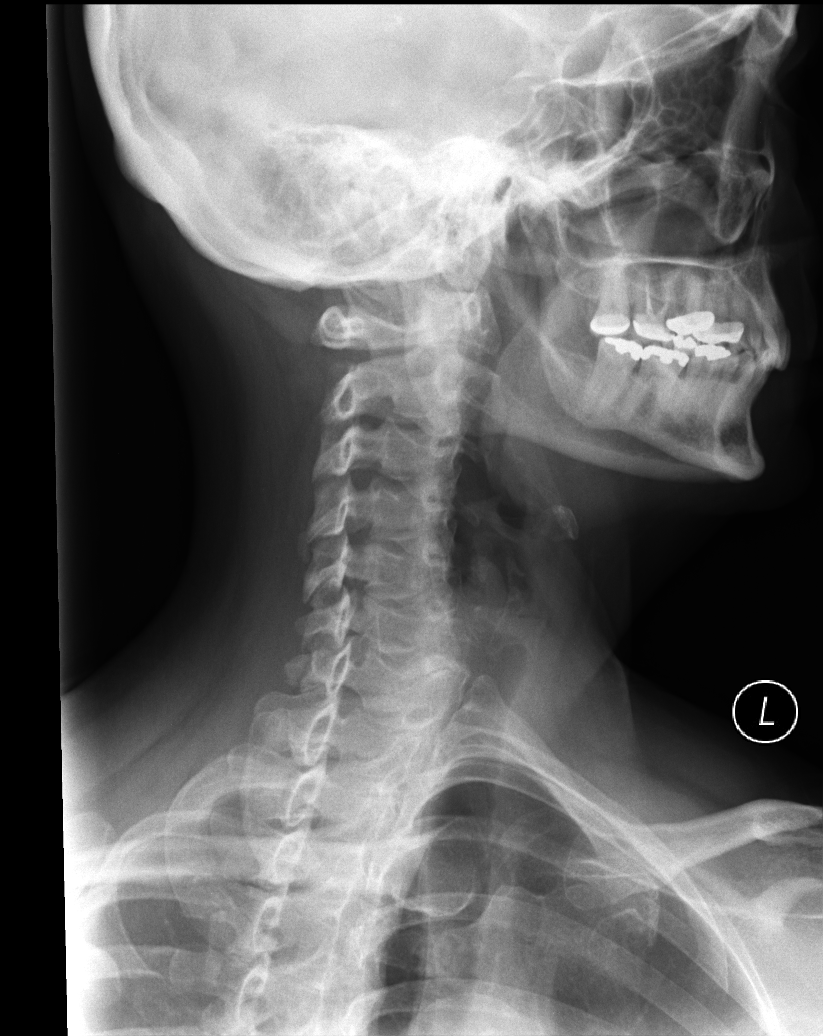

[lateral]
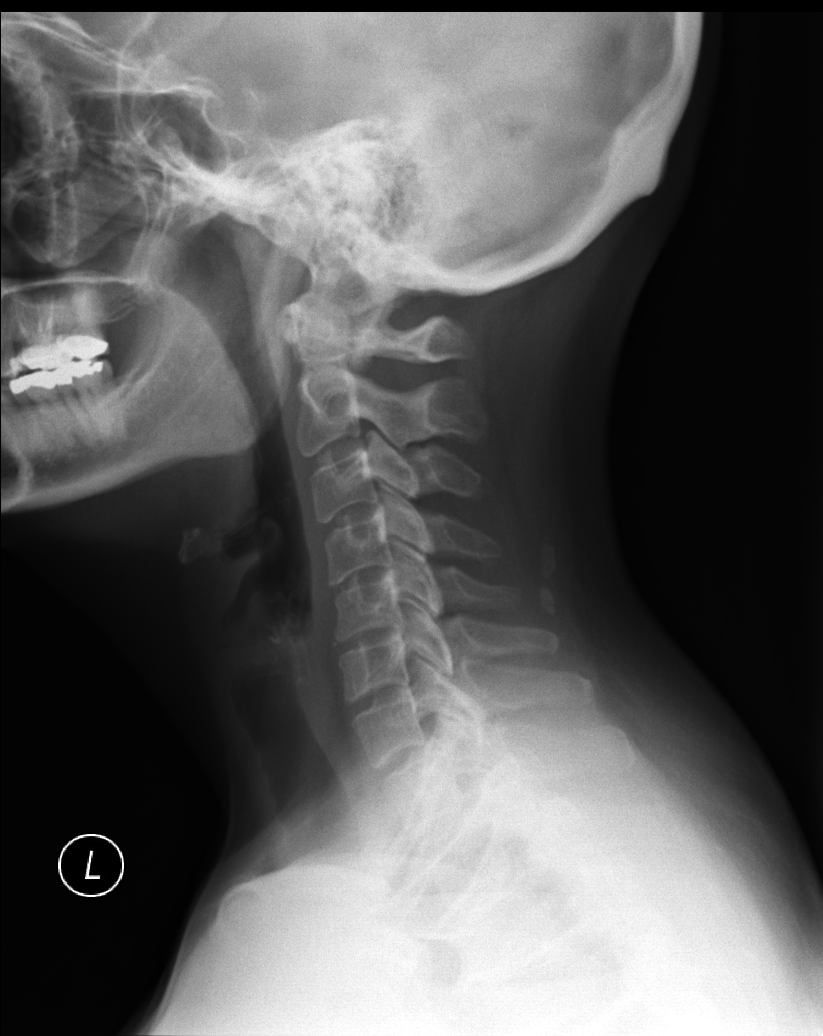

[rpo]
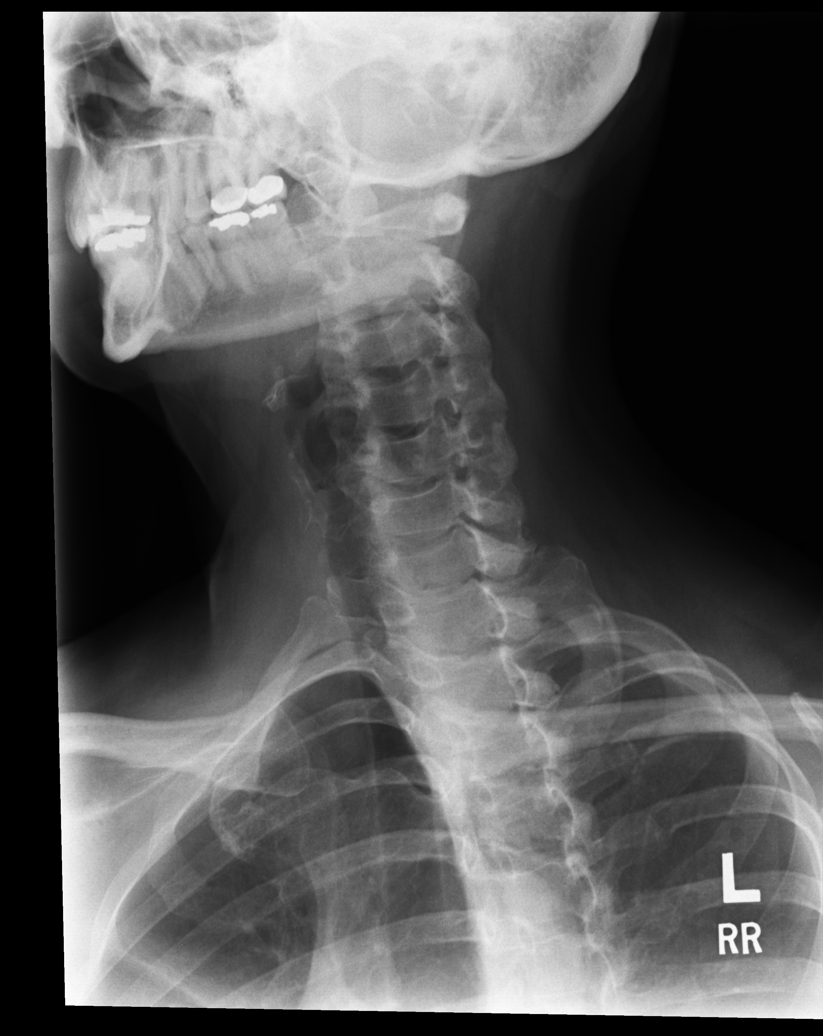

[AP]
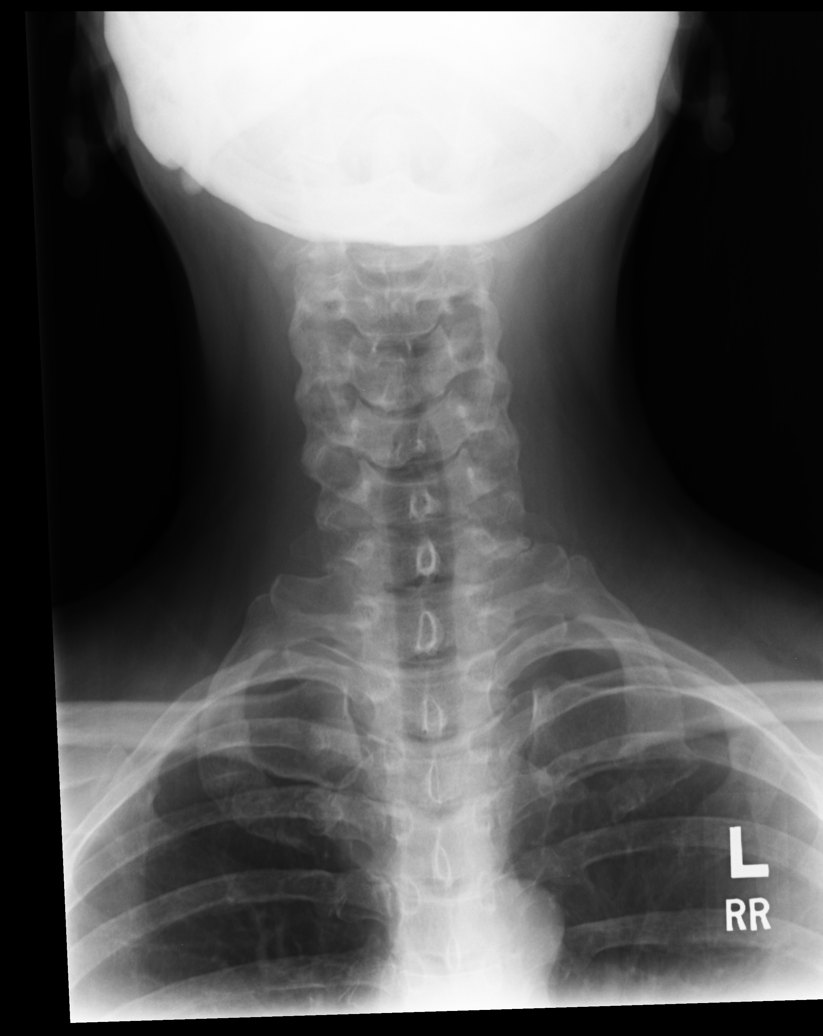

[ap open mouth]
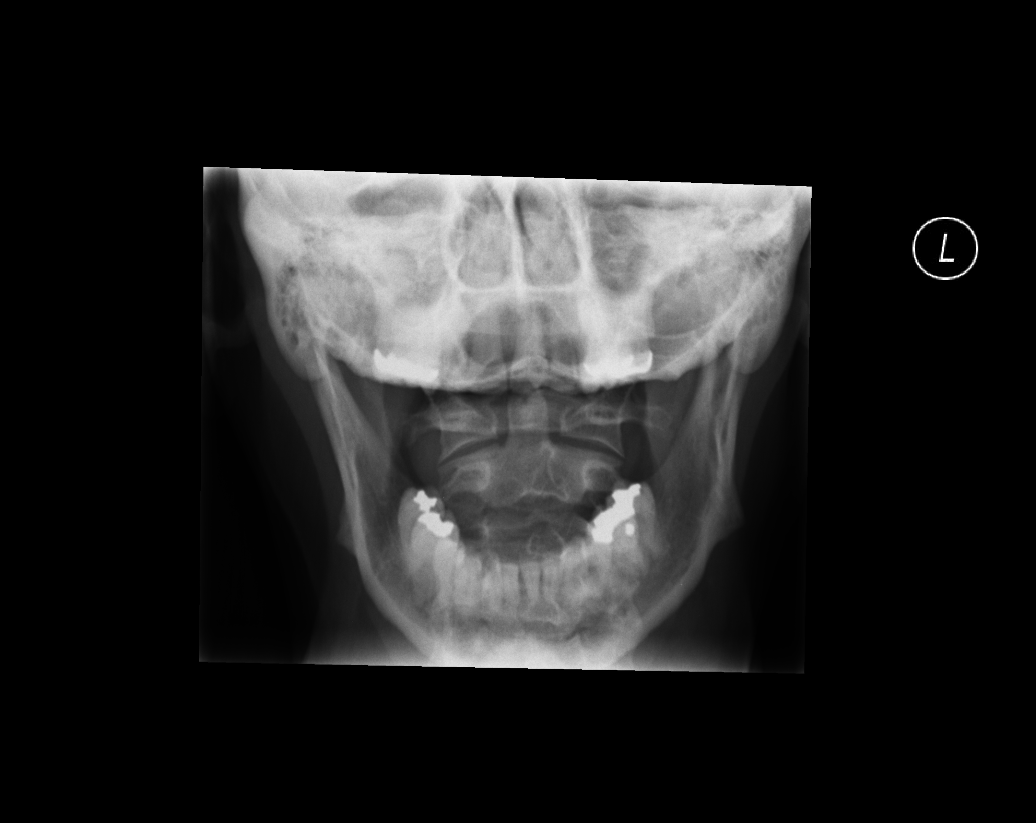

[5 of 5 positions shown; findings below may reference images not displayed]

FINDINGS: C1 to the inferior endplate of T1 is visualized on the lateral
radiograph.  There is mild straightening and slight reversal of the
expected cervical lordosis.  No anterolisthesis or retrolisthesis.
The dens is normally positioned between the lateral masses of C1.
Vertebral body heights are preserved. Prevertebral soft tissues are
normal.

There is very mild DDD of C4 - C5 and C5 - C6 with minimal disc
space height loss.  Calcifications are seen within the nuchal
ligament.  There is suggestion of bilateral neural foraminal
narrowing within the inferior aspect of the right cervical spine
and within the mid and inferior aspect of the left cervical spine
but this may be accentuated due to obliquities.

Limited visualization of the lung apices is normal.  Regional soft
tissues are normal.
IMPRESSION: Mild DDD of C4 - C5 and C5 - C6.

## 2012-04-28 ENCOUNTER — Other Ambulatory Visit: Payer: Self-pay | Admitting: *Deleted

## 2012-04-28 MED ORDER — AMPHETAMINE-DEXTROAMPHETAMINE 10 MG PO TABS: 10.0000 mg | ORAL_TABLET | Freq: Every day | ORAL | Status: DC

## 2012-04-28 MED ORDER — AMPHETAMINE-DEXTROAMPHET ER 30 MG PO CP24: 30.0000 mg | ORAL_CAPSULE | ORAL | Status: DC

## 2012-06-27 ENCOUNTER — Encounter: Payer: Self-pay | Admitting: *Deleted

## 2012-08-12 ENCOUNTER — Other Ambulatory Visit: Payer: Self-pay | Admitting: *Deleted

## 2012-08-12 ENCOUNTER — Telehealth: Payer: Self-pay | Admitting: Physician Assistant

## 2012-08-12 MED ORDER — AMPHETAMINE-DEXTROAMPHETAMINE 10 MG PO TABS: 10.0000 mg | ORAL_TABLET | Freq: Every day | ORAL | Status: DC

## 2012-08-12 MED ORDER — AMPHETAMINE-DEXTROAMPHET ER 30 MG PO CP24: 30.0000 mg | ORAL_CAPSULE | ORAL | Status: DC

## 2012-08-12 NOTE — Telephone Encounter (Signed)
Patient needs a refill on her med and please call and let her know the status on this. Thanks

## 2012-09-20 ENCOUNTER — Telehealth: Payer: Self-pay | Admitting: *Deleted

## 2012-09-20 NOTE — Telephone Encounter (Signed)
Pt calls and states got her Adderall rx few weeks ago and did not get it filled at the time and now can not find the rx. Wants to know if you will give her another rx for it.

## 2012-09-21 MED ORDER — AMPHETAMINE-DEXTROAMPHETAMINE 10 MG PO TABS: 10.0000 mg | ORAL_TABLET | Freq: Every day | ORAL | Status: DC

## 2012-09-21 MED ORDER — AMPHETAMINE-DEXTROAMPHET ER 30 MG PO CP24: 30.0000 mg | ORAL_CAPSULE | ORAL | Status: DC

## 2012-09-21 NOTE — Telephone Encounter (Signed)
Pt.notified

## 2012-09-21 NOTE — Telephone Encounter (Signed)
Call pt: Will refill/ Please keep Rx safe we cannot do this often.

## 2012-12-16 ENCOUNTER — Telehealth: Payer: Self-pay | Admitting: *Deleted

## 2012-12-16 MED ORDER — AMPHETAMINE-DEXTROAMPHETAMINE 10 MG PO TABS: 10.0000 mg | ORAL_TABLET | Freq: Every day | ORAL | Status: DC

## 2012-12-16 MED ORDER — AMPHETAMINE-DEXTROAMPHET ER 30 MG PO CP24: 30.0000 mg | ORAL_CAPSULE | ORAL | Status: DC

## 2012-12-16 NOTE — Telephone Encounter (Signed)
Refilled 1 month. Need to be seen for BP/HR recheck while on medication.

## 2012-12-16 NOTE — Telephone Encounter (Signed)
Patient calls and request a refill on the Adderall

## 2013-01-23 ENCOUNTER — Encounter: Payer: Self-pay | Admitting: Physician Assistant

## 2013-01-23 ENCOUNTER — Ambulatory Visit (INDEPENDENT_AMBULATORY_CARE_PROVIDER_SITE_OTHER): Payer: BC Managed Care – PPO | Admitting: Physician Assistant

## 2013-01-23 ENCOUNTER — Other Ambulatory Visit (HOSPITAL_COMMUNITY)
Admission: RE | Admit: 2013-01-23 | Discharge: 2013-01-23 | Disposition: A | Discharge status: Home / Self Care | Payer: BC Managed Care – PPO | Source: Ambulatory Visit | Attending: Family Medicine | Admitting: Family Medicine

## 2013-01-23 VITALS — BP 114/72 | HR 71 | Wt 125.0 lb

## 2013-01-23 DIAGNOSIS — Z01419 Encounter for gynecological examination (general) (routine) without abnormal findings: Secondary | ICD-10-CM | POA: Insufficient documentation

## 2013-01-23 DIAGNOSIS — Z131 Encounter for screening for diabetes mellitus: Secondary | ICD-10-CM

## 2013-01-23 DIAGNOSIS — E785 Hyperlipidemia, unspecified: Secondary | ICD-10-CM

## 2013-01-23 DIAGNOSIS — Z1211 Encounter for screening for malignant neoplasm of colon: Secondary | ICD-10-CM

## 2013-01-23 DIAGNOSIS — Z79899 Other long term (current) drug therapy: Secondary | ICD-10-CM

## 2013-01-23 MED ORDER — AMPHETAMINE-DEXTROAMPHET ER 30 MG PO CP24: 30.0000 mg | ORAL_CAPSULE | ORAL | Status: DC

## 2013-01-23 MED ORDER — AMPHETAMINE-DEXTROAMPHETAMINE 10 MG PO TABS: 10.0000 mg | ORAL_TABLET | Freq: Every day | ORAL | Status: DC

## 2013-01-23 NOTE — Patient Instructions (Addendum)
1200mg  of calcium 4-5 servings of dairy. Vitamin D 800IU.   Will schedule for Colonoscopy and will call pt.   Get mammogram in September.

## 2013-01-25 NOTE — Progress Notes (Signed)
  Subjective:     Heather Sullivan is a 52 y.o. female and is here for a comprehensive physical exam. The patient reports problems - none.   ADD controlled on medication does need refill. Denies any anorexia, palpitations, insomnia.Marland Kitchen  History   Social History  . Marital Status: Married    Spouse Name: N/A    Number of Children: N/A  . Years of Education: N/A   Occupational History  . Not on file.   Social History Main Topics  . Smoking status: Never Smoker   . Smokeless tobacco: Not on file  . Alcohol Use: 1.0 oz/week    2 drink(s) per week     Comment: per week  . Drug Use:   . Sexually Active: Yes    Birth Control/ Protection: Condom   Other Topics Concern  . Not on file   Social History Narrative  . No narrative on file   Health Maintenance  Topic Date Due  . Colonoscopy  01/19/2010  . Influenza Vaccine  06/05/2013  . Mammogram  06/10/2013  . Pap Smear  01/24/2016  . Tetanus/tdap  07/12/2019    The following portions of the patient's history were reviewed and updated as appropriate: allergies, current medications, past family history, past medical history, past social history, past surgical history and problem list.  Review of Systems A comprehensive review of systems was negative.   Objective:    BP 114/72  Pulse 71  Wt 125 lb (56.7 kg)  BMI 22.68 kg/m2  LMP 04/11/2011 General appearance: alert, cooperative and appears stated age Head: Normocephalic, without obvious abnormality, atraumatic Eyes: conjunctivae/corneas clear. PERRL, EOM's intact. Fundi benign. Ears: normal TM's and external ear canals both ears Nose: Nares normal. Septum midline. Mucosa normal. No drainage or sinus tenderness. Throat: lips, mucosa, and tongue normal; teeth and gums normal Neck: no adenopathy, no carotid bruit, no JVD, supple, symmetrical, trachea midline and thyroid not enlarged, symmetric, no tenderness/mass/nodules Back: symmetric, no curvature. ROM normal. No CVA  tenderness. Lungs: clear to auscultation bilaterally Breasts: normal appearance, no masses or tenderness Heart: regular rate and rhythm, S1, S2 normal, no murmur, click, rub or gallop Abdomen: soft, non-tender; bowel sounds normal; no masses,  no organomegaly Pelvic: cervix normal in appearance, external genitalia normal, no adnexal masses or tenderness, no cervical motion tenderness, uterus normal size, shape, and consistency and vagina normal without discharge Extremities: extremities normal, atraumatic, no cyanosis or edema Pulses: 2+ and symmetric Skin: Skin color, texture, turgor normal. No rashes or lesions Lymph nodes: Cervical, supraclavicular, and axillary nodes normal. Neurologic: Grossly normal    Assessment:    Healthy female exam.      Plan:    CPE- Pap today. Vaccines up to date. Will refer for colonoscopy. Mammogram up to date. Fasting lab slip given. Discussed calcium and vitamin D.   ADD- meds refilled for 2 months to get on cycle with daughter then she would like 90 day supply. I did print for 3 months single rx but voided last month to get on cycle with daughter.  See After Visit Summary for Counseling Recommendations

## 2013-04-03 ENCOUNTER — Telehealth: Payer: Self-pay | Admitting: *Deleted

## 2013-04-03 NOTE — Telephone Encounter (Signed)
Pt was in the office and requested a refill on adderall

## 2013-04-10 ENCOUNTER — Other Ambulatory Visit: Payer: Self-pay | Admitting: Physician Assistant

## 2013-04-10 MED ORDER — AMPHETAMINE-DEXTROAMPHET ER 30 MG PO CP24: 30.0000 mg | ORAL_CAPSULE | ORAL | Status: DC

## 2013-04-10 MED ORDER — AMPHETAMINE-DEXTROAMPHETAMINE 10 MG PO TABS: 10.0000 mg | ORAL_TABLET | Freq: Every day | ORAL | Status: DC

## 2013-04-10 NOTE — Telephone Encounter (Signed)
Pt.notified

## 2013-04-10 NOTE — Telephone Encounter (Signed)
Call pt let know 90 day rx for adderall ready for pick up.

## 2013-06-26 ENCOUNTER — Encounter: Payer: Self-pay | Admitting: *Deleted

## 2013-07-14 ENCOUNTER — Other Ambulatory Visit: Payer: Self-pay

## 2013-07-14 MED ORDER — AMPHETAMINE-DEXTROAMPHETAMINE 10 MG PO TABS: 10.0000 mg | ORAL_TABLET | Freq: Every day | ORAL | Status: DC

## 2013-07-14 MED ORDER — AMPHETAMINE-DEXTROAMPHET ER 30 MG PO CP24: 30.0000 mg | ORAL_CAPSULE | ORAL | Status: DC

## 2013-07-28 ENCOUNTER — Ambulatory Visit: Payer: BC Managed Care – PPO | Admitting: Physician Assistant

## 2013-08-30 ENCOUNTER — Ambulatory Visit (INDEPENDENT_AMBULATORY_CARE_PROVIDER_SITE_OTHER): Payer: BC Managed Care – PPO | Admitting: Physician Assistant

## 2013-08-30 ENCOUNTER — Encounter: Payer: Self-pay | Admitting: Physician Assistant

## 2013-08-30 VITALS — BP 116/73 | HR 73 | Wt 132.0 lb

## 2013-08-30 DIAGNOSIS — F988 Other specified behavioral and emotional disorders with onset usually occurring in childhood and adolescence: Secondary | ICD-10-CM

## 2013-08-30 DIAGNOSIS — N952 Postmenopausal atrophic vaginitis: Secondary | ICD-10-CM

## 2013-08-30 MED ORDER — AMPHETAMINE-DEXTROAMPHETAMINE 10 MG PO TABS: 10.0000 mg | ORAL_TABLET | Freq: Every day | ORAL | Status: DC

## 2013-08-30 MED ORDER — ESTRADIOL 0.1 MG/GM VA CREA: TOPICAL_CREAM | VAGINAL | Status: DC

## 2013-08-30 MED ORDER — AMPHETAMINE-DEXTROAMPHET ER 30 MG PO CP24: 30.0000 mg | ORAL_CAPSULE | ORAL | Status: DC

## 2013-08-30 NOTE — Progress Notes (Signed)
   Subjective:    Patient ID: Heather Sullivan, female    DOB: 1961/05/20, 52 y.o.   MRN: [id number]  HPI Patient is a 52 year old female who presents to the clinic to followup for ADD. Patient is on Adderall [facility] relief as well as immediate release in the afternoon. She is doing well on this regimen and remains productive. Patient denies any insomnia, anorexia or palpitations.  Patient also reports some vaginal burning with sexual intercourse. She denies any burning outside of sex. She denies any low back pain or abdominal pain. She has not tried anything to make better and nothing seems to make worse. This pain is not with every sexual encounter but with most for the last year. No fever, chills, nausea, vomiting today.   Review of Systems     Objective:   Physical Exam  Constitutional: She is oriented to person, place, and time. She appears well-developed and well-nourished.  HENT:  Head: Normocephalic and atraumatic.  Cardiovascular: Normal rate, regular rhythm and normal heart sounds.   Pulmonary/Chest: Effort normal and breath sounds normal.  Neurological: She is alert and oriented to person, place, and time.  Skin: Skin is warm and dry.  Psychiatric: She has a normal mood and affect. Her behavior is normal.          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 Dr. Ivan Anchors refilled for 90 days supply. Followup in 3 months.  Vaginal atrophy/postmenopausal symptoms- discuss with patient she could try over-the-counter Replens for the next couple months and if not improving. Consider vaginal estrogen cream. Patient would like to go ahead and get copy of vaginal estrogen to try if anything over-the-counter does not work. Discuss with patient taper of every day estrogen to lowest effective(no symptoms) dose. We'll followup in 3 months.

## 2013-08-30 NOTE — Patient Instructions (Addendum)
Estradiol vaginal cream What is this medicine? ESTRADIOL (es tra DYE ole) contains the female hormone estrogen. It is used for symptoms of menopause, like vaginal dryness and irritation. This medicine may be used for other purposes; ask your health care provider or pharmacist if you have questions. COMMON BRAND NAME(S): Estrace What should I tell my health care provider before I take this medicine? They need to know if you have any of these conditions: -abnormal vaginal bleeding -blood vessel disease or blood clots -breast, cervical, endometrial, ovarian, liver, or uterine cancer -dementia -diabetes -gallbladder disease -heart disease or recent heart attack -high blood pressure -high cholesterol -high levels of calcium in the blood -hysterectomy -kidney disease -liver disease -migraine headaches -protein C deficiency -protein S deficiency -stroke -systemic lupus erythematosus (SLE) -tobacco smoker -an unusual or allergic reaction to estrogens, other hormones, soy, other medicines, foods, dyes, or preservatives -pregnant or trying to get pregnant -breast-feeding How should I use this medicine? This medicine is for use in the vagina only. Do not take by mouth. Follow the directions on the prescription label. Read package directions carefully before using. Use the special applicator supplied with the cream. Wash hands before and after use. Fill the applicator with the prescribed amount of cream. Lie on your back, part and bend your knees. Insert the applicator into the vagina and push the plunger to expel the cream into the vagina. Wash the applicator with warm soapy water and rinse well. Use exactly as directed for the complete length of time prescribed. Do not stop using except on the advice of your doctor or health care professional. A patient package insert for the product will be given with each prescription and refill. Read this sheet carefully each time. The sheet may change  frequently. Talk to your pediatrician regarding the use of this medicine in children. This medicine is not approved for use in children. Overdosage: If you think you have taken too much of this medicine contact a poison control center or emergency room at once. NOTE: This medicine is only for you. Do not share this medicine with others. What if I miss a dose? If you miss a dose, use it as soon as you can. If it is almost time for your next dose, use only that dose. Do not use double or extra doses. What may interact with this medicine? Do not take this medicine with any of the following medications: -aromatase inhibitors like aminoglutethimide, anastrozole, exemestane, letrozole, testolactone This medicine may also interact with the following medications: -barbiturates used for inducing sleep or treating seizures -carbamazepine -grapefruit juice -medicines for fungal infections like ketoconazole and itraconazole -raloxifene -rifabutin -rifampin -rifapentine -ritonavir -some antibiotics used to treat infections -St. John's Wort -tamoxifen -warfarin This list may not describe all possible interactions. Give your health care provider a list of all the medicines, herbs, non-prescription drugs, or dietary supplements you use. Also tell them if you smoke, drink alcohol, or use illegal drugs. Some items may interact with your medicine. What should I watch for while using this medicine? Visit your health care professional for regular checks on your progress. You will need a regular breast and pelvic exam. You should also discuss the need for regular mammograms with your health care professional, and follow his or her guidelines. This medicine can make your body retain fluid, making your fingers, hands, or ankles swell. Your blood pressure can go up. Contact your doctor or health care professional if you feel you are retaining fluid. If you have   any reason to think you are pregnant, stop taking  this medicine at once and contact your doctor or health care professional. Tobacco smoking increases the risk of getting a blood clot or having a stroke, especially if you are more than 53 years old. You are strongly advised not to smoke. If you wear contact lenses and notice visual changes, or if the lenses begin to feel uncomfortable, consult your eye care specialist. If you are going to have elective surgery, you may need to stop taking this medicine beforehand. Consult your health care professional for advice prior to scheduling the surgery. What side effects may I notice from receiving this medicine? Side effects that you should report to your doctor or health care professional as soon as possible: -allergic reactions like skin rash, itching or hives, swelling of the face, lips, or tongue -breast tissue changes or discharge -changes in vision -chest pain -confusion, trouble speaking or understanding -dark urine -general ill feeling or flu-like symptoms -light-colored stools -nausea, vomiting -pain, swelling, warmth in the leg -right upper belly pain -severe headaches -shortness of breath -sudden numbness or weakness of the face, arm or leg -trouble walking, dizziness, loss of balance or coordination -unusual vaginal bleeding -yellowing of the eyes or skin Side effects that usually do not require medical attention (report to your doctor or health care professional if they continue or are bothersome): -hair loss -increased hunger or thirst -increased urination -symptoms of vaginal infection like itching, irritation or unusual discharge -unusually weak or tired This list may not describe all possible side effects. Call your doctor for medical advice about side effects. You may report side effects to FDA at 1-800-FDA-1088. Where should I keep my medicine? Keep out of the reach of children. Store at room temperature between 15 and 30 degrees C (59 and 86 degrees F). Protect from  temperatures above 40 degrees C (104 degrees C). Do not freeze. Throw away any unused medicine after the expiration date. NOTE: This sheet is a summary. It may not cover all possible information. If you have questions about this medicine, talk to your doctor, pharmacist, or health care provider.  2014, Elsevier/Gold Standard. (2010-12-24 09:18:12)  

## 2013-09-04 ENCOUNTER — Telehealth: Payer: Self-pay | Admitting: Family Medicine

## 2013-09-04 NOTE — Telephone Encounter (Signed)
Discussed case with Jade, signed adderall rx to allow patient to have 90 day supply. 

## 2013-11-01 ENCOUNTER — Encounter: Payer: Self-pay | Admitting: Internal Medicine

## 2013-12-01 ENCOUNTER — Ambulatory Visit (AMBULATORY_SURGERY_CENTER): Payer: Self-pay | Admitting: *Deleted

## 2013-12-01 VITALS — Ht 62.0 in | Wt 135.4 lb

## 2013-12-01 DIAGNOSIS — Z1211 Encounter for screening for malignant neoplasm of colon: Secondary | ICD-10-CM

## 2013-12-01 MED ORDER — MOVIPREP 100 G PO SOLR: ORAL | Status: DC

## 2013-12-01 NOTE — Progress Notes (Signed)
No allergies to eggs or soy. No problems with anesthesia.  

## 2013-12-06 ENCOUNTER — Encounter: Payer: Self-pay | Admitting: Internal Medicine

## 2013-12-12 ENCOUNTER — Telehealth: Payer: Self-pay | Admitting: *Deleted

## 2013-12-12 MED ORDER — AMPHETAMINE-DEXTROAMPHET ER 30 MG PO CP24: 30.0000 mg | ORAL_CAPSULE | ORAL | Status: DC

## 2013-12-12 MED ORDER — AMPHETAMINE-DEXTROAMPHETAMINE 10 MG PO TABS: 10.0000 mg | ORAL_TABLET | Freq: Every day | ORAL | Status: DC

## 2013-12-12 NOTE — Telephone Encounter (Signed)
Pt calls and request refill on Aderrall. She is due for an appointment as well.

## 2013-12-15 ENCOUNTER — Ambulatory Visit (AMBULATORY_SURGERY_CENTER): Payer: BC Managed Care – PPO | Admitting: Internal Medicine

## 2013-12-15 ENCOUNTER — Encounter: Payer: Self-pay | Admitting: Internal Medicine

## 2013-12-15 VITALS — BP 106/72 | HR 60 | Temp 98.7°F | Resp 16 | Ht 62.0 in | Wt 135.0 lb

## 2013-12-15 DIAGNOSIS — D126 Benign neoplasm of colon, unspecified: Secondary | ICD-10-CM

## 2013-12-15 DIAGNOSIS — Z1211 Encounter for screening for malignant neoplasm of colon: Secondary | ICD-10-CM

## 2013-12-15 MED ORDER — SODIUM CHLORIDE 0.9 % IV SOLN: 500.0000 mL | INTRAVENOUS | Status: DC

## 2013-12-15 NOTE — Op Note (Signed)
[facility]  [address] [place], [address]   COLONOSCOPY PROCEDURE REPORT  PATIENT: Heather Sullivan, Heather Sullivan  MR#: [id number] BIRTHDATE: [birth year] , 36  yrs. old GENDER: Female ENDOSCOPIST: Jerene Bears, MD PROCEDURE DATE:  12/15/2013 PROCEDURE:   Colonoscopy with snare polypectomy First Screening Colonoscopy - Avg.  risk and is 50 yrs.  old or older Yes.  Prior Negative Screening - Now for repeat screening. N/A  History of Adenoma - Now for follow-up colonoscopy & has been > or = to 3 yrs.  N/A  Polyps Removed Today? Yes. ASA CLASS:   Class II INDICATIONS:average risk screening and first colonoscopy. MEDICATIONS: MAC sedation, administered by CRNA and propofol (Diprivan) 300mg  IV  DESCRIPTION OF PROCEDURE:   After the risks benefits and alternatives of the procedure were thoroughly explained, informed consent was obtained.  A digital rectal exam revealed no rectal mass.   The LB [id number]  endoscope was introduced through the anus and advanced to the cecum, which was identified by both the appendix and ileocecal valve. No adverse events experienced. The quality of the prep was Moviprep fair  The instrument was then slowly withdrawn as the colon was fully examined.   COLON FINDINGS: Three sessile polyps measuring 3-8 mm in size were found at the cecum, hepatic flexure, and in the transverse colon. Polypectomy was performed using cold snare (2) and using hot snare (1 at hepatic flexure).  All resections were complete and all polyp tissue was completely retrieved.   Mild diverticulosis was noted in the ascending colon, at the hepatic flexure, in the descending colon, and sigmoid colon.  Retroflexed views revealed no abnormalities. The time to cecum=3 minutes 25 seconds.  Withdrawal time=16 minutes 13 seconds.  The scope was withdrawn and the procedure completed. COMPLICATIONS: There were no complications.  ENDOSCOPIC IMPRESSION: 1.   Three sessile polyps  measuring 3-8 mm in size were found at the cecum, hepatic flexure, and in the transverse colon; Polypectomy was performed using cold snare and using hot snare 2.   Mild diverticulosis was noted in the ascending colon, at the hepatic flexure, in the descending colon, and sigmoid colon  RECOMMENDATIONS: 1.  Hold aspirin, aspirin products, and anti-inflammatory medication for 2 weeks. 2.  Await pathology results 3.  High fiber diet 4.  Timing of repeat colonoscopy will be determined by pathology findings. 5.  You will receive a letter within 1-2 weeks with the results of your biopsy as well as final recommendations.  Please call my office if you have not received a letter after 3 weeks.   eSigned:  Jerene Bears, MD 12/15/2013 12:16 PM cc: The Patient and Iran Planas PA-DC

## 2013-12-15 NOTE — Progress Notes (Signed)
Called to room to assist during endoscopic procedure.  Patient ID and intended procedure confirmed with present staff. Received instructions for my participation in the procedure from the performing physician.  

## 2013-12-15 NOTE — Patient Instructions (Signed)
YOU HAD AN ENDOSCOPIC PROCEDURE TODAY AT THE [facility]: Refer to the procedure report that was given to you for any specific questions about what was found during the examination.  If the procedure report does not answer your questions, please call your gastroenterologist to clarify.  If you requested that your care partner not be given the details of your procedure findings, then the procedure report has been included in a sealed envelope for you to review at your convenience later.  YOU SHOULD EXPECT: Some feelings of bloating in the abdomen. Passage of more gas than usual.  Walking can help get rid of the air that was put into your GI tract during the procedure and reduce the bloating. If you had a lower endoscopy (such as a colonoscopy or flexible sigmoidoscopy) you may notice spotting of blood in your stool or on the toilet paper. If you underwent a bowel prep for your procedure, then you may not have a normal bowel movement for a few days.  DIET: Your first meal following the procedure should be a light meal and then it is ok to progress to your normal diet.  A half-sandwich or bowl of soup is an example of a good first meal.  Heavy or fried foods are harder to digest and may make you feel nauseous or bloated.  Likewise meals heavy in dairy and vegetables can cause extra gas to form and this can also increase the bloating.  Drink plenty of fluids but you should avoid alcoholic beverages for 24 hours.  ACTIVITY: Your care partner should take you home directly after the procedure.  You should plan to take it easy, moving slowly for the rest of the day.  You can resume normal activity the day after the procedure however you should NOT DRIVE or use heavy machinery for 24 hours (because of the sedation medicines used during the test).    SYMPTOMS TO REPORT IMMEDIATELY: A gastroenterologist can be reached at any hour.  During normal business hours, 8:30 AM to 5:00 PM Monday through Friday,  call [phone].  After hours and on weekends, please call the GI answering service at [phone] who will take a message and have the physician on call contact you.   Following lower endoscopy (colonoscopy or flexible sigmoidoscopy):  Excessive amounts of blood in the stool  Significant tenderness or worsening of abdominal pains  Swelling of the abdomen that is new, acute  Fever of 100F or higher    FOLLOW UP: If any biopsies were taken you will be contacted by phone or by letter within the next 1-3 weeks.  Call your gastroenterologist if you have not heard about the biopsies in 3 weeks.  Our staff will call the home number listed on your records the next business day following your procedure to check on you and address any questions or concerns that you may have at that time regarding the information given to you following your procedure. This is a courtesy call and so if there is no answer at the home number and we have not heard from you through the emergency physician on call, we will assume that you have returned to your regular daily activities without incident.  SIGNATURES/CONFIDENTIALITY: You and/or your care partner have signed paperwork which will be entered into your electronic medical record.  These signatures attest to the fact that that the information above on your After Visit Summary has been reviewed and is understood.  Full responsibility of the confidentiality   of this discharge information lies with you and/or your care-partner.   HOLD ASPIRIN AND ANTI INFLAMMATORY PRODUCTS FOR 2 WEEKS  INFORMATION ON POLYPS,DIVERTICULOSIS,AND HIGH FIBER DIET GIVEN TO YOU TODAY

## 2013-12-18 ENCOUNTER — Telehealth: Payer: Self-pay | Admitting: *Deleted

## 2013-12-18 NOTE — Telephone Encounter (Signed)
Left message that we called for f/u 

## 2013-12-20 ENCOUNTER — Encounter: Payer: Self-pay | Admitting: Internal Medicine

## 2014-03-13 ENCOUNTER — Other Ambulatory Visit: Payer: Self-pay | Admitting: Family Medicine

## 2014-03-13 MED ORDER — AMPHETAMINE-DEXTROAMPHET ER 30 MG PO CP24: 30.0000 mg | ORAL_CAPSULE | ORAL | Status: DC

## 2014-07-02 ENCOUNTER — Ambulatory Visit (INDEPENDENT_AMBULATORY_CARE_PROVIDER_SITE_OTHER): Payer: BC Managed Care – PPO | Admitting: Physician Assistant

## 2014-07-02 ENCOUNTER — Encounter: Payer: Self-pay | Admitting: Physician Assistant

## 2014-07-02 VITALS — BP 120/73 | HR 69 | Ht 62.0 in | Wt 136.0 lb

## 2014-07-02 DIAGNOSIS — F988 Other specified behavioral and emotional disorders with onset usually occurring in childhood and adolescence: Secondary | ICD-10-CM | POA: Diagnosis not present

## 2014-07-02 DIAGNOSIS — R635 Abnormal weight gain: Secondary | ICD-10-CM

## 2014-07-02 DIAGNOSIS — Z13220 Encounter for screening for lipoid disorders: Secondary | ICD-10-CM | POA: Diagnosis not present

## 2014-07-02 DIAGNOSIS — Z131 Encounter for screening for diabetes mellitus: Secondary | ICD-10-CM

## 2014-07-02 LAB — COMPLETE METABOLIC PANEL WITH GFR
ALT: 70 U/L — AB (ref 0–35)
AST: 28 U/L (ref 0–37)
Albumin: 4.3 g/dL (ref 3.5–5.2)
Alkaline Phosphatase: 69 U/L (ref 39–117)
BUN: 15 mg/dL (ref 6–23)
CALCIUM: 9.7 mg/dL (ref 8.4–10.5)
CHLORIDE: 106 meq/L (ref 96–112)
CO2: 27 meq/L (ref 19–32)
Creat: 0.82 mg/dL (ref 0.50–1.10)
GFR, Est Non African American: 81 mL/min
Glucose, Bld: 87 mg/dL (ref 70–99)
Potassium: 4.5 mEq/L (ref 3.5–5.3)
Sodium: 143 mEq/L (ref 135–145)
Total Bilirubin: 0.5 mg/dL (ref 0.2–1.2)
Total Protein: 6.7 g/dL (ref 6.0–8.3)

## 2014-07-02 LAB — LIPID PANEL
Cholesterol: 186 mg/dL (ref 0–200)
HDL: 57 mg/dL (ref >39)
LDL Cholesterol: 113 mg/dL — ABNORMAL HIGH (ref 0–99)
Total CHOL/HDL Ratio: 3.3 Ratio
Triglycerides: 78 mg/dL (ref <150)
VLDL: 16 mg/dL (ref 0–40)

## 2014-07-02 MED ORDER — AMPHETAMINE-DEXTROAMPHET ER 30 MG PO CP24: 30.0000 mg | ORAL_CAPSULE | ORAL | Status: DC

## 2014-07-02 MED ORDER — PHENTERMINE HCL 37.5 MG PO CAPS: 37.5000 mg | ORAL_CAPSULE | ORAL | Status: DC

## 2014-07-02 NOTE — Progress Notes (Signed)
   Subjective:    Patient ID: Heather Sullivan, female    DOB: 1961-06-07, 53 y.o.   MRN: [id number]  HPI Pt presents to the clinic for ADD follow up and to discuss phentermine.   ADD- doing well on XR 30mg  daily. Not taking 10mg  daily only if absolutely needs. Denies any insomnia, headache, anorexia, palpitations.   She has been off and on phentermine for last couple of years. Last time she used phentermine was 6 months ago. She feels stuck. She exercises but not daily. She uses my fitness pal but has not been as dedicated.      Review of Systems  All other systems reviewed and are negative.      Objective:   Physical Exam  Constitutional: She is oriented to person, place, and time. She appears well-developed and well-nourished.  HENT:  Head: Normocephalic and atraumatic.  Cardiovascular: Normal rate, regular rhythm and normal heart sounds.   Pulmonary/Chest: Effort normal and breath sounds normal. She has no wheezes.  Neurological: She is alert and oriented to person, place, and time.  Skin: Skin is dry.  Psychiatric: She has a normal mood and affect. Her behavior is normal.          Assessment & Plan:  ADD- refilled 90 days of Adderall. Follow up in 3 months.   Abnormal weight gain- discussed diet and exercise being mainstay of weight loss. Pt aware phentermine can lose effectiveness after being used over and over. Will give for 1 month. Follow up for weight check. Discussed in normal BMI range will use for motivation and to help change habits. Vitals look great today.   Not had screening labs done in a while. Sent order.

## 2014-07-03 LAB — TSH: TSH: 2.887 u[IU]/mL (ref 0.350–4.500)

## 2014-07-16 ENCOUNTER — Encounter: Payer: Self-pay | Admitting: Physician Assistant

## 2014-12-26 ENCOUNTER — Ambulatory Visit (INDEPENDENT_AMBULATORY_CARE_PROVIDER_SITE_OTHER): Payer: BLUE CROSS/BLUE SHIELD | Admitting: Physician Assistant

## 2014-12-26 ENCOUNTER — Encounter: Payer: Self-pay | Admitting: Physician Assistant

## 2014-12-26 VITALS — BP 96/65 | HR 68 | Ht 62.0 in | Wt 140.0 lb

## 2014-12-26 DIAGNOSIS — F909 Attention-deficit hyperactivity disorder, unspecified type: Secondary | ICD-10-CM | POA: Diagnosis not present

## 2014-12-26 DIAGNOSIS — F988 Other specified behavioral and emotional disorders with onset usually occurring in childhood and adolescence: Secondary | ICD-10-CM

## 2014-12-26 MED ORDER — AMPHETAMINE-DEXTROAMPHETAMINE 10 MG PO TABS: 10.0000 mg | ORAL_TABLET | Freq: Every day | ORAL | Status: DC

## 2014-12-26 MED ORDER — AMPHETAMINE-DEXTROAMPHET ER 30 MG PO CP24: 30.0000 mg | ORAL_CAPSULE | ORAL | Status: DC

## 2014-12-26 NOTE — Progress Notes (Signed)
   Subjective:    Patient ID: Heather Sullivan, female    DOB: [birth year], 54 y.o.   MRN: [id number]  HPI  Pt is a 54 yo female who presents to the clinic for ADD refill. She tried to go off for the last month or so and has not worked out. She is has been much more scattered at work and not completing task. She does not feel as good as well. Has no problems with medications but hard for her to come in every 3 months for rechecks.   Review of Systems  All other systems reviewed and are negative.      Objective:   Physical Exam  Constitutional: She is oriented to person, place, and time. She appears well-developed and well-nourished.  HENT:  Head: Normocephalic and atraumatic.  Cardiovascular: Normal rate, regular rhythm and normal heart sounds.   Pulmonary/Chest: Effort normal and breath sounds normal.  Neurological: She is alert and oriented to person, place, and time.  Skin: Skin is dry.  Psychiatric: She has a normal mood and affect. Her behavior is normal.          Assessment & Plan:  ADD- refilled medications for 90 day. Pt request 90 day rx. Follow up in 3 months.

## 2015-03-25 ENCOUNTER — Encounter: Payer: Self-pay | Admitting: Family Medicine

## 2015-03-25 ENCOUNTER — Ambulatory Visit (INDEPENDENT_AMBULATORY_CARE_PROVIDER_SITE_OTHER): Payer: BLUE CROSS/BLUE SHIELD | Admitting: Family Medicine

## 2015-03-25 VITALS — BP 123/75 | HR 58 | Ht 62.0 in | Wt 145.0 lb

## 2015-03-25 DIAGNOSIS — F988 Other specified behavioral and emotional disorders with onset usually occurring in childhood and adolescence: Secondary | ICD-10-CM

## 2015-03-25 DIAGNOSIS — F909 Attention-deficit hyperactivity disorder, unspecified type: Secondary | ICD-10-CM

## 2015-03-25 MED ORDER — AMPHETAMINE-DEXTROAMPHET ER 30 MG PO CP24: 30.0000 mg | ORAL_CAPSULE | ORAL | Status: DC

## 2015-03-25 MED ORDER — AMPHETAMINE-DEXTROAMPHETAMINE 10 MG PO TABS: 10.0000 mg | ORAL_TABLET | Freq: Every day | ORAL | Status: DC

## 2015-03-25 NOTE — Progress Notes (Signed)
CC: Heather Sullivan is a 54 y.o. female is here for Follow-up   Subjective: HPI:  Follow-up ADD: Continues to take Adderall XR every morning and immediate release late in the afternoon. States that symptoms of disorganization and inattentiveness that were significantly interfering with quality of life and productivity are now resolved and absent provided she takes the above medication. She denies any known side effects. She denies unintentional weight loss, anxiety, paranoia, sleep disturbance.   Review Of Systems Outlined In HPI  Past Medical History  Diagnosis Date  . ADD (attention deficit disorder)     Past Surgical History  Procedure Laterality Date  . Femoral hernia repair  1981  . Cesarean section  1991, 1997  . Tonsillectomy  1967   Family History  Problem Relation Age of Onset  . Cancer Mother     postmenopausal CA/breast  . Colon cancer Neg Hx     History   Social History  . Marital Status: Married    Spouse Name: N/A  . Number of Children: N/A  . Years of Education: N/A   Occupational History  . Not on file.   Social History Main Topics  . Smoking status: Never Smoker   . Smokeless tobacco: Never Used  . Alcohol Use: 0.0 oz/week     Comment: rare alcohol intake  . Drug Use: No  . Sexual Activity: Yes    Birth Control/ Protection: Condom   Other Topics Concern  . Not on file   Social History Narrative     Objective: BP 123/75 mmHg  Pulse 58  Ht 5\' 2"  (1.575 m)  Wt 145 lb (65.772 kg)  BMI 26.51 kg/m2  LMP 04/11/2011  Vital signs reviewed. General: Alert and Oriented, No Acute Distress HEENT: Pupils equal, round, reactive to light. Conjunctivae clear.  External ears unremarkable.  Moist mucous membranes. Lungs: Clear and comfortable work of breathing, speaking in full sentences without accessory muscle use. Cardiac: Regular rate and rhythm.  Neuro: CN II-XII grossly intact, gait normal. Extremities: No peripheral edema.  Strong peripheral  pulses.  Mental Status: No depression, anxiety, nor agitation. Logical though process. Skin: Warm and dry.  Assessment & Plan: Heather Sullivan was seen today for follow-up.  Diagnoses and all orders for this visit:  Attention deficit disorder  Other orders -     amphetamine-dextroamphetamine (ADDERALL) 10 MG tablet; Take 1 tablet (10 mg total) by mouth daily. -     amphetamine-dextroamphetamine (ADDERALL XR) 30 MG 24 hr capsule; Take 1 capsule (30 mg total) by mouth every morning.   ADD: Controlled on Adderall, [place] controlled substance database reviewed, refills provided today. Follow-up 3 months   Return in about 3 months (around 06/25/2015).

## 2015-07-25 ENCOUNTER — Ambulatory Visit (INDEPENDENT_AMBULATORY_CARE_PROVIDER_SITE_OTHER): Payer: BLUE CROSS/BLUE SHIELD | Admitting: Physician Assistant

## 2015-07-25 ENCOUNTER — Encounter: Payer: Self-pay | Admitting: Physician Assistant

## 2015-07-25 VITALS — BP 122/76 | HR 70 | Wt 141.0 lb

## 2015-07-25 DIAGNOSIS — F909 Attention-deficit hyperactivity disorder, unspecified type: Secondary | ICD-10-CM | POA: Diagnosis not present

## 2015-07-25 DIAGNOSIS — R159 Full incontinence of feces: Secondary | ICD-10-CM | POA: Diagnosis not present

## 2015-07-25 DIAGNOSIS — F988 Other specified behavioral and emotional disorders with onset usually occurring in childhood and adolescence: Secondary | ICD-10-CM

## 2015-07-25 MED ORDER — AMPHETAMINE-DEXTROAMPHET ER 30 MG PO CP24: 30.0000 mg | ORAL_CAPSULE | ORAL | Status: DC

## 2015-07-25 MED ORDER — AMPHETAMINE-DEXTROAMPHETAMINE 10 MG PO TABS: 10.0000 mg | ORAL_TABLET | Freq: Every day | ORAL | Status: DC

## 2015-07-25 NOTE — Patient Instructions (Signed)
Fiber 20-30g Avoid caffiene Drink water Kegals.   Fecal Incontinence Fecal incontinence, also called accidental bowel leakage, is not being able to control your bowels. This condition happens because the nerves or muscles around the anus do not work the way they should. This affects their ability to hold stool. CAUSES  This condition may be caused by:  Damage to the muscles at the end of the rectum (sphincter).  Damage to the nerves that control bowel movements.  Diarrhea.  Chronic constipation.  Pelvic floor dysfunction. This means the muscles in the pelvis do not work well.  Loss of bowel storage capacity. RISK FACTORS This condition is more likely to develop in people who:   Are born with bowels or a pelvis that has not formed correctly.  Have had rectal surgery.  Have had radiation treatment for certain cancers.  Have irritable bowel syndrome (IBS).  Have an inflammatory bowel disease (IBD), such as Crohn disease.  Have been pregnant, had a vaginal delivery, or had surgery that damaged the pelvic floor muscles.  Have a complicated childbirth, spinal cord injury, or other trauma that causes nerve damage.  Have a condition that can affect nerve function, such as diabetes, Parkinson disease, or multiple sclerosis.  Have a condition where the rectum drops down into the anus or vagina (prolapse).  Are older. SYMPTOMS  The main symptom of this condition is not being able to control your bowels. You also might not be able get to the bathroom before a bowel movement. DIAGNOSIS  This condition is diagnosed with a medical history and physical exam. You may also have tests, including:   Blood tests.  Urine tests.  A rectal exam.  Ultrasound.  MRI.  Colonoscopy. This is an exam that looks at your large intestine (colon).  Anal manometry. This is a test that measures the strength of the anal sphincter.  Anal electromyogram (EMG). This is a test that uses small  electrodes to check for nerve damage. TREATMENT  Treatment varies depending on the cause and severity of your condition. Treatment may also focus on addressing any underlying causes of this condition. Treatment may include:  Medicines. This may include medicines to:  Prevent diarrhea.  Help with constipation (laxatives).  Treat any underlying conditions.  Physical therapy.  Fiber supplements. These can help manage your bowel movements.  Nerve stimulation.  Injectable gel to promote tissue growth and better muscle control.  Surgery. You may need:  Sphincter repair surgery.  Diversion surgery. This procedure lets feces pass out of your body through a hole in your abdomen. HOME CARE INSTRUCTIONS  Diet  Follow instructions from your health care provider about any eating or drinking restrictions. Work with a dietitian to come up with a healthy diet and to help you avoid the foods that can make your condition worse. Keep a diet diary to find out which foods or drinks could be making your fecal incontinence worse.  Drink enough fluid to keep your urine clear or pale yellow. Lifestyle  If you smoke, talk to your health care provider about quitting. This may help your condition.  If you are overweight, talk to your health care provider about how to safely lose weight. This may help your condition.  Increase your physical activity as told by your health care provider. This may help your condition. Always talk to your health care provider before starting a new exercise program.  Carry a change of clothes and supplies to clean up quickly if you have an  episode of fetal incontinence.  Consider joining a fecal incontinence support group. You can find a support group online or in your local community. General Instructions  Take over-the-counter and prescription medicines only as told by your health care provider. This includes any supplements.  Apply a moisture barrier, such as  petroleum jelly, to your rectum. This protects the skin from irritation caused by ongoing leaking or diarrhea.  Tell your health care provider if you are upset or depressed about your condition.  SEEK MEDICAL CARE IF:   You have a fever.  You have redness, swelling, or pain around your rectum.  Your pain is getting worse or you lose feeling in your rectal area.  You have blood in your stool.  You feel sad or hopeless.  You avoid social or work situations. SEEK IMMEDIATE MEDICAL CARE IF:   You stop having bowel movements.  You cannot eat or drink without vomiting.  You have rectal bleeding that does not stop.  You have severe pain that is getting worse.  You have symptoms of dehydration, including:  Sleepiness or fatigue.  Producing little or no urine, tears, or sweat.  Dizziness.  Dry mouth.  Unusual irritability.  Headache.  Inability to think clearly. FOR MORE INFORMATION  American Academy of [employer]icians: www.AromatherapyParty.no International Foundation for Functional Gastrointestinal Disorders: www.iffgd.org   This information is not intended to replace advice given to you by your health care provider. Make sure you discuss any questions you have with your health care provider.   Document Released: 09/02/2004 Document Revised: 06/12/2015 Document Reviewed: 02/27/2015 Elsevier Interactive Patient Education [employer].

## 2015-07-26 NOTE — Progress Notes (Signed)
   Subjective:    Patient ID: Heather Sullivan, female    DOB: 1961/01/11, 54 y.o.   MRN: [id number]  HPI  patient is a 54 year old female who presents to the clinic for ADHD follow-up. She is doing well on current dose. She has no concerns or complaints regarding her ADHD. She is able to work effectively.  She does mention that she has been having some fecal incontinence lately. She has noticed this for the last couple months. She does notice it is worse when her bowels are loose. She denies any anal sex or trauma to the anus. She has had 2 vaginal births of children greater than 8-1/2 pounds. She denies any abdominal pain, blood in stool, painful bowel movements. She has not tried anything to make better.  Review of Systems  All other systems reviewed and are negative.      Objective:   Physical Exam  Constitutional: She is oriented to person, place, and time. She appears well-developed and well-nourished.  HENT:  Head: Normocephalic and atraumatic.  Cardiovascular: Normal rate, regular rhythm and normal heart sounds.   Pulmonary/Chest: Effort normal and breath sounds normal.  Neurological: She is alert and oriented to person, place, and time.  Skin: Skin is dry.  Psychiatric: She has a normal mood and affect. Her behavior is normal.          Assessment & Plan:   ADHD-Adderall refilled for 3 months. Follow-up in 3 months.  Fecal incontinence-handout was given. Discussed importance of cables. I do think there are some anal sphincter tone but is lacking. Encouraged patient to increase fiber in diet to 20-30 g a day. Drink plenty of water. Avoid caffeine. Follow-up if symptoms are worsening.

## 2016-01-01 ENCOUNTER — Ambulatory Visit (INDEPENDENT_AMBULATORY_CARE_PROVIDER_SITE_OTHER): Payer: BLUE CROSS/BLUE SHIELD | Admitting: Physician Assistant

## 2016-01-01 ENCOUNTER — Encounter: Payer: Self-pay | Admitting: Physician Assistant

## 2016-01-01 VITALS — BP 123/61 | HR 69 | Ht 62.0 in | Wt 149.0 lb

## 2016-01-01 DIAGNOSIS — Z131 Encounter for screening for diabetes mellitus: Secondary | ICD-10-CM | POA: Diagnosis not present

## 2016-01-01 DIAGNOSIS — F988 Other specified behavioral and emotional disorders with onset usually occurring in childhood and adolescence: Secondary | ICD-10-CM

## 2016-01-01 DIAGNOSIS — Z1159 Encounter for screening for other viral diseases: Secondary | ICD-10-CM

## 2016-01-01 DIAGNOSIS — Z114 Encounter for screening for human immunodeficiency virus [HIV]: Secondary | ICD-10-CM

## 2016-01-01 DIAGNOSIS — F909 Attention-deficit hyperactivity disorder, unspecified type: Secondary | ICD-10-CM

## 2016-01-01 DIAGNOSIS — Z13220 Encounter for screening for lipoid disorders: Secondary | ICD-10-CM | POA: Diagnosis not present

## 2016-01-01 DIAGNOSIS — Z566 Other physical and mental strain related to work: Secondary | ICD-10-CM

## 2016-01-01 DIAGNOSIS — R635 Abnormal weight gain: Secondary | ICD-10-CM

## 2016-01-01 LAB — LIPID PANEL
CHOL/HDL RATIO: 3.2 ratio (ref <=5.0)
Cholesterol: 188 mg/dL (ref 125–200)
HDL: 58 mg/dL (ref >=46)
LDL CALC: 114 mg/dL (ref <130)
Triglycerides: 79 mg/dL (ref <150)
VLDL: 16 mg/dL (ref <30)

## 2016-01-01 LAB — COMPLETE METABOLIC PANEL WITH GFR
ALBUMIN: 4.1 g/dL (ref 3.6–5.1)
ALK PHOS: 55 U/L (ref 33–130)
ALT: 19 U/L (ref 6–29)
AST: 17 U/L (ref 10–35)
BILIRUBIN TOTAL: 0.4 mg/dL (ref 0.2–1.2)
BUN: 20 mg/dL (ref 7–25)
CALCIUM: 9.3 mg/dL (ref 8.6–10.4)
CO2: 25 mmol/L (ref 20–31)
Chloride: 109 mmol/L (ref 98–110)
Creat: 0.83 mg/dL (ref 0.50–1.05)
GFR, EST NON AFRICAN AMERICAN: 80 mL/min (ref >=60)
GFR, Est African American: >89 mL/min (ref >=60)
GLUCOSE: 87 mg/dL (ref 65–99)
POTASSIUM: 4.5 mmol/L (ref 3.5–5.3)
SODIUM: 144 mmol/L (ref 135–146)
TOTAL PROTEIN: 6.4 g/dL (ref 6.1–8.1)

## 2016-01-01 MED ORDER — BUPROPION HCL ER (XL) 150 MG PO TB24: 150.0000 mg | ORAL_TABLET | Freq: Every day | ORAL | Status: DC

## 2016-01-01 MED ORDER — AMPHETAMINE-DEXTROAMPHET ER 30 MG PO CP24: 30.0000 mg | ORAL_CAPSULE | ORAL | Status: DC

## 2016-01-01 MED ORDER — AMPHETAMINE-DEXTROAMPHETAMINE 10 MG PO TABS: 10.0000 mg | ORAL_TABLET | Freq: Every day | ORAL | Status: DC

## 2016-01-01 NOTE — Progress Notes (Signed)
   Subjective:    Patient ID: Heather Sullivan, female    DOB: 1961/01/08, 55 y.o.   MRN: [id number]  HPI Pt is a 55 yo female who would like to restart ADD medications. She just switched jobs and finding it difficult to concenrate and get things done. She has been on stimulants for years. She is frustrated with recent weight gain. She is stressed at work and overeating. She just feels "ugh". Needs something to get her motivated.   Review of Systems  All other systems reviewed and are negative.      Objective:   Physical Exam  Constitutional: She is oriented to person, place, and time. She appears well-developed and well-nourished.  HENT:  Head: Normocephalic and atraumatic.  Cardiovascular: Normal rate, regular rhythm and normal heart sounds.   Pulmonary/Chest: Effort normal and breath sounds normal.  Neurological: She is alert and oriented to person, place, and time.  Psychiatric: She has a normal mood and affect. Her behavior is normal.          Assessment & Plan:  ADD- refilled for 3 months.   Work stress/abnormal weight gain- discussed options. I do not want to put on phentermine with stimulant for weight. wellbutrin could help with overall stress and overeating. Discussed side effects. Follow up in 3 months. Discussed diet and exercise.   Needs pap. Fasting labs ordered.

## 2016-01-01 NOTE — Patient Instructions (Signed)
Wellbutrin Topamax

## 2016-01-02 LAB — HEPATITIS C ANTIBODY: HCV AB: NEGATIVE

## 2016-01-02 LAB — HIV ANTIBODY (ROUTINE TESTING W REFLEX): HIV: NONREACTIVE

## 2016-03-28 ENCOUNTER — Other Ambulatory Visit: Payer: Self-pay | Admitting: Physician Assistant

## 2016-04-24 ENCOUNTER — Encounter: Payer: Self-pay | Admitting: Physician Assistant

## 2016-04-24 ENCOUNTER — Ambulatory Visit (INDEPENDENT_AMBULATORY_CARE_PROVIDER_SITE_OTHER): Payer: BLUE CROSS/BLUE SHIELD | Admitting: Physician Assistant

## 2016-04-24 VITALS — BP 127/67 | HR 70 | Ht 62.0 in | Wt 141.0 lb

## 2016-04-24 DIAGNOSIS — Z566 Other physical and mental strain related to work: Secondary | ICD-10-CM | POA: Diagnosis not present

## 2016-04-24 DIAGNOSIS — F909 Attention-deficit hyperactivity disorder, unspecified type: Secondary | ICD-10-CM

## 2016-04-24 DIAGNOSIS — H6121 Impacted cerumen, right ear: Secondary | ICD-10-CM | POA: Diagnosis not present

## 2016-04-24 DIAGNOSIS — F988 Other specified behavioral and emotional disorders with onset usually occurring in childhood and adolescence: Secondary | ICD-10-CM

## 2016-04-24 MED ORDER — AMPHETAMINE-DEXTROAMPHETAMINE 10 MG PO TABS: 10.0000 mg | ORAL_TABLET | Freq: Every day | ORAL | Status: DC

## 2016-04-24 MED ORDER — BUPROPION HCL ER (XL) 300 MG PO TB24: 300.0000 mg | ORAL_TABLET | Freq: Every day | ORAL | Status: DC

## 2016-04-24 MED ORDER — AMPHETAMINE-DEXTROAMPHET ER 30 MG PO CP24: 30.0000 mg | ORAL_CAPSULE | ORAL | Status: DC

## 2016-04-24 NOTE — Progress Notes (Addendum)
   Subjective:    Patient ID: Heather Sullivan, female    DOB: December 26, 1960, 55 y.o.   MRN: [id number]  HPI Pt is a 55 year old female that presents for follow up on her ADD and work related stress. She states that Wellbutrin has improved her mood some. She has also lost weight which she finds beneficial. She denies any unintentional weight loss, CP, palpitations, dizziness, or sleep distrubances. She is doing well on stimulant dose. No insomnia or increased anxiety or palpitations.   She also complains of fullness in the right ear for the last 2-3 months that  has decreased hearing in that ear. She reports having a history of cerumen impactions.  She denies ear pain and discharge.  Review of Systems  All other systems reviewed and are negative.      Objective:   Physical Exam  Constitutional: She is oriented to person, place, and time. She appears well-developed and well-nourished.  HENT:  Head: Normocephalic and atraumatic.  Right Ear: External ear and ear canal normal.  Left Ear: External ear and ear canal normal.  Unable to visualize TM in right ear. Cerumen impaction.  Cardiovascular: Normal rate, regular rhythm and normal heart sounds.   Pulmonary/Chest: Effort normal and breath sounds normal.  Neurological: She is alert and oriented to person, place, and time.  Psychiatric: She has a normal mood and affect. Her behavior is normal.       Assessment & Plan:  ADD- refilled for 3 months.  Work-Related Stress- will increase Wellbutrin to 300mg  to maximize mood improvement and weight loss. Follow up 3 months.   Overweight- doing well. Lost 8lbs. Continue exercise and healthy diet.   Right ear cerumen impaction- irrigation of right ear was done in the office today. TM was visualized after irrigation.

## 2016-05-03 ENCOUNTER — Other Ambulatory Visit: Payer: Self-pay | Admitting: Physician Assistant

## 2016-06-18 DIAGNOSIS — Z0100 Encounter for examination of eyes and vision without abnormal findings: Secondary | ICD-10-CM | POA: Diagnosis not present

## 2016-07-22 ENCOUNTER — Other Ambulatory Visit: Payer: Self-pay | Admitting: Physician Assistant

## 2016-07-31 ENCOUNTER — Ambulatory Visit (INDEPENDENT_AMBULATORY_CARE_PROVIDER_SITE_OTHER): Payer: BLUE CROSS/BLUE SHIELD | Admitting: Physician Assistant

## 2016-07-31 ENCOUNTER — Encounter: Payer: Self-pay | Admitting: Physician Assistant

## 2016-07-31 VITALS — BP 118/69 | HR 60 | Ht 62.0 in | Wt 142.0 lb

## 2016-07-31 DIAGNOSIS — Z566 Other physical and mental strain related to work: Secondary | ICD-10-CM | POA: Diagnosis not present

## 2016-07-31 DIAGNOSIS — Z23 Encounter for immunization: Secondary | ICD-10-CM

## 2016-07-31 DIAGNOSIS — F900 Attention-deficit hyperactivity disorder, predominantly inattentive type: Secondary | ICD-10-CM

## 2016-07-31 MED ORDER — AMPHETAMINE-DEXTROAMPHETAMINE 10 MG PO TABS: 10.0000 mg | ORAL_TABLET | Freq: Every day | ORAL | 0 refills | Status: DC

## 2016-07-31 MED ORDER — BUPROPION HCL ER (XL) 300 MG PO TB24: ORAL_TABLET | ORAL | 3 refills | Status: DC

## 2016-07-31 MED ORDER — AMPHETAMINE-DEXTROAMPHET ER 30 MG PO CP24: 30.0000 mg | ORAL_CAPSULE | ORAL | 0 refills | Status: DC

## 2016-07-31 NOTE — Progress Notes (Signed)
   Subjective:    Patient ID: Heather Sullivan, female    DOB: 1961/08/25, 55 y.o.   MRN: [id number]  HPI Pt is a 55 yo female who presents to the clinic to follow up on ADD. She is taking adderall and on same dose for over a year. No problems or concerns. Denies any CP, palpitations, headaches, or dizziness.    Review of Systems  All other systems reviewed and are negative.      Objective:   Physical Exam  Constitutional: She is oriented to person, place, and time. She appears well-developed and well-nourished.  HENT:  Head: Normocephalic and atraumatic.  Cardiovascular: Normal rate, regular rhythm and normal heart sounds.   Pulmonary/Chest: Effort normal and breath sounds normal.  Neurological: She is alert and oriented to person, place, and time.  Psychiatric: She has a normal mood and affect. Her behavior is normal.          Assessment & Plan:  Marland KitchenMarland KitchenHilma was seen today for adhd.  Diagnoses and all orders for this visit:  Influenza vaccine needed -     Flu Vaccine QUAD 36+ mos PF IM (Fluarix & Fluzone Quad PF)  Attention deficit hyperactivity disorder (ADHD), predominantly inattentive type -     buPROPion (WELLBUTRIN XL) 300 MG 24 hr tablet; TAKE 1 TABLET(300 MG) BY MOUTH DAILY  Work-related stress -     buPROPion (WELLBUTRIN XL) 300 MG 24 hr tablet; TAKE 1 TABLET(300 MG) BY MOUTH DAILY  Other orders -     amphetamine-dextroamphetamine (ADDERALL) 10 MG tablet; Take 1 tablet (10 mg total) by mouth daily. -     amphetamine-dextroamphetamine (ADDERALL XR) 30 MG 24 hr capsule; Take 1 capsule (30 mg total) by mouth every morning.   Follow up 3 months for ADD.   Flu shot given today.

## 2016-08-17 DIAGNOSIS — Z1231 Encounter for screening mammogram for malignant neoplasm of breast: Secondary | ICD-10-CM | POA: Diagnosis not present

## 2016-08-17 DIAGNOSIS — Z803 Family history of malignant neoplasm of breast: Secondary | ICD-10-CM | POA: Diagnosis not present

## 2016-08-17 LAB — HM MAMMOGRAPHY

## 2016-09-18 ENCOUNTER — Encounter: Payer: Self-pay | Admitting: *Deleted

## 2016-10-21 ENCOUNTER — Other Ambulatory Visit: Payer: Self-pay | Admitting: Physician Assistant

## 2016-11-02 ENCOUNTER — Encounter: Payer: Self-pay | Admitting: Internal Medicine

## 2016-11-06 ENCOUNTER — Telehealth: Payer: Self-pay | Admitting: Physician Assistant

## 2016-11-06 ENCOUNTER — Encounter: Payer: Self-pay | Admitting: Physician Assistant

## 2016-11-06 ENCOUNTER — Ambulatory Visit (INDEPENDENT_AMBULATORY_CARE_PROVIDER_SITE_OTHER): Payer: BLUE CROSS/BLUE SHIELD | Admitting: Physician Assistant

## 2016-11-06 VITALS — BP 120/73 | HR 83 | Ht 62.0 in | Wt 146.0 lb

## 2016-11-06 DIAGNOSIS — F900 Attention-deficit hyperactivity disorder, predominantly inattentive type: Secondary | ICD-10-CM | POA: Diagnosis not present

## 2016-11-06 MED ORDER — AMPHETAMINE-DEXTROAMPHETAMINE 10 MG PO TABS: 10.0000 mg | ORAL_TABLET | Freq: Every day | ORAL | 0 refills | Status: DC

## 2016-11-06 MED ORDER — AMPHETAMINE-DEXTROAMPHET ER 30 MG PO CP24: 30.0000 mg | ORAL_CAPSULE | ORAL | 0 refills | Status: DC

## 2016-11-06 NOTE — Telephone Encounter (Signed)
Please call and get mammogram done in November in [place] as [facility].

## 2016-11-06 NOTE — Patient Instructions (Signed)
[facility] dermatology and cosmetology, Dr. Frederico Hamman.

## 2016-11-06 NOTE — Progress Notes (Signed)
   Subjective:    Patient ID: Heather Sullivan, female    DOB: May 30, 1961, 56 y.o.   MRN: [id number]  HPI  Pt is a 56 yo female who presents to the clinic for ADD follow up. Pt denies any SOB, palpitations, headaches, insomnia. She is doing great. No problems or concerns.    She had her mammogram done at [facility].    Review of Systems  All other systems reviewed and are negative.      Objective:   Physical Exam  Constitutional: She is oriented to person, place, and time. She appears well-developed and well-nourished.  HENT:  Head: Normocephalic and atraumatic.  Cardiovascular: Normal rate, regular rhythm and normal heart sounds.   Pulmonary/Chest: Effort normal and breath sounds normal.  Neurological: She is alert and oriented to person, place, and time.  Psychiatric: She has a normal mood and affect. Her behavior is normal.          Assessment & Plan:  Marland KitchenMarland KitchenArilla was seen today for adhd.  Diagnoses and all orders for this visit:  Attention deficit hyperactivity disorder (ADHD), predominantly inattentive type -     amphetamine-dextroamphetamine (ADDERALL XR) 30 MG 24 hr capsule; Take 1 capsule (30 mg total) by mouth every morning. -     amphetamine-dextroamphetamine (ADDERALL) 10 MG tablet; Take 1 tablet (10 mg total) by mouth daily.   Follow up in 3 months.   Will call and get mammogram report.

## 2016-12-22 NOTE — Telephone Encounter (Signed)
Request faxed

## 2016-12-24 ENCOUNTER — Encounter: Payer: Self-pay | Admitting: Physician Assistant

## 2017-02-08 ENCOUNTER — Encounter: Payer: Self-pay | Admitting: Physician Assistant

## 2017-02-08 ENCOUNTER — Ambulatory Visit (INDEPENDENT_AMBULATORY_CARE_PROVIDER_SITE_OTHER): Payer: BLUE CROSS/BLUE SHIELD | Admitting: Physician Assistant

## 2017-02-08 VITALS — BP 125/78 | HR 82 | Ht 62.0 in | Wt 145.0 lb

## 2017-02-08 DIAGNOSIS — H6122 Impacted cerumen, left ear: Secondary | ICD-10-CM

## 2017-02-08 DIAGNOSIS — Z131 Encounter for screening for diabetes mellitus: Secondary | ICD-10-CM | POA: Diagnosis not present

## 2017-02-08 DIAGNOSIS — F900 Attention-deficit hyperactivity disorder, predominantly inattentive type: Secondary | ICD-10-CM | POA: Diagnosis not present

## 2017-02-08 DIAGNOSIS — R0989 Other specified symptoms and signs involving the circulatory and respiratory systems: Secondary | ICD-10-CM | POA: Insufficient documentation

## 2017-02-08 DIAGNOSIS — Z13220 Encounter for screening for lipoid disorders: Secondary | ICD-10-CM | POA: Diagnosis not present

## 2017-02-08 LAB — COMPLETE METABOLIC PANEL WITH GFR
ALT: 10 U/L (ref 6–29)
AST: 16 U/L (ref 10–35)
Albumin: 4.2 g/dL (ref 3.6–5.1)
Alkaline Phosphatase: 61 U/L (ref 33–130)
BUN: 17 mg/dL (ref 7–25)
CALCIUM: 9.2 mg/dL (ref 8.6–10.4)
CHLORIDE: 109 mmol/L (ref 98–110)
CO2: 25 mmol/L (ref 20–31)
CREATININE: 1.02 mg/dL (ref 0.50–1.05)
GFR, EST AFRICAN AMERICAN: 71 mL/min (ref >=60)
GFR, EST NON AFRICAN AMERICAN: 61 mL/min (ref >=60)
Glucose, Bld: 87 mg/dL (ref 65–99)
POTASSIUM: 4.1 mmol/L (ref 3.5–5.3)
Sodium: 144 mmol/L (ref 135–146)
Total Bilirubin: 0.5 mg/dL (ref 0.2–1.2)
Total Protein: 6.5 g/dL (ref 6.1–8.1)

## 2017-02-08 LAB — LIPID PANEL
CHOL/HDL RATIO: 3 ratio (ref <5.0)
CHOLESTEROL: 184 mg/dL (ref <200)
HDL: 61 mg/dL (ref >50)
LDL CALC: 108 mg/dL — AB (ref <100)
TRIGLYCERIDES: 74 mg/dL (ref <150)
VLDL: 15 mg/dL (ref <30)

## 2017-02-08 MED ORDER — AMPHETAMINE-DEXTROAMPHET ER 30 MG PO CP24: 30.0000 mg | ORAL_CAPSULE | ORAL | 0 refills | Status: DC

## 2017-02-08 MED ORDER — AMPHETAMINE-DEXTROAMPHETAMINE 10 MG PO TABS: 10.0000 mg | ORAL_TABLET | Freq: Every day | ORAL | 0 refills | Status: DC

## 2017-02-08 NOTE — Progress Notes (Signed)
   Subjective:    Patient ID: Heather Sullivan, female    DOB: 14-May-1961, 56 y.o.   MRN: [id number]  HPI Pt is a 56 yo female who presents to the clinic for 3 month ADD follow up on adderall. She is doing well on current dose. She denies any insomnia, palpitations, headaches.   She has noticed a problem for the last year on and off with sniffifling. She almost feels like she cannot get a good breath so she breathes in through her nose a few times. She almost feels like it is a habit now. Worse in spring and summer and better through winter. Seems to be more in cold air conditioning. She tried zyrtec for 6 months with no improvement. No wheezing, SOB, sinus pressure, head    Review of Systems  All other systems reviewed and are negative.      Objective:   Physical Exam  Constitutional: She is oriented to person, place, and time. She appears well-developed and well-nourished.  HENT:  Head: Normocephalic and atraumatic.  Right Ear: External ear normal.  Mouth/Throat: Oropharynx is clear and moist. No oropharyngeal exudate.  Left TM with cerumen impaction. After irrigation normal TM.  Negative for any tenderness over sinuses to palpation.  Bilateral nasal turbinates red and swollen right greater than left.    Eyes: Conjunctivae are normal. Right eye exhibits no discharge. Left eye exhibits no discharge.  Neck: Normal range of motion. Neck supple. No thyromegaly present.  Cardiovascular: Normal rate, regular rhythm and normal heart sounds.   Pulmonary/Chest: Effort normal and breath sounds normal.  Lymphadenopathy:    She has no cervical adenopathy.  Neurological: She is alert and oriented to person, place, and time.          Assessment & Plan:  Marland KitchenMarland KitchenDebroah was seen today for adhd.  Diagnoses and all orders for this visit:  Attention deficit hyperactivity disorder (ADHD), predominantly inattentive type -     amphetamine-dextroamphetamine (ADDERALL XR) 30 MG 24 hr capsule; Take 1  capsule (30 mg total) by mouth every morning. -     amphetamine-dextroamphetamine (ADDERALL) 10 MG tablet; Take 1 tablet (10 mg total) by mouth daily.  Screening for diabetes mellitus -     COMPLETE METABOLIC PANEL WITH GFR -     Hemoglobin A1c  Screening for lipoid disorders -     Lipid panel  Chronic sniffling  Left ear impacted cerumen   Refilled adderall for 3 months.   Unclear etiology with sniffling. Consider as needed afrin( do not use longer than 3 days) and flonase daily. Let me know how doing in 3 weeks.   Marland Kitchen.Indication: Cerumen impaction of the ear(s)  Medical necessity statement: On physical examination, cerumen impairs clinically significant portions of the external auditory canal, and tympanic membrane. Noted obstructive, copious cerumen that cannot be removed without magnification and instrumentations requiring physician skills Consent: Discussed benefits and risks of procedure and verbal consent obtained Procedure: Patient was prepped for the procedure. Utilized an otoscope to assess and take note of the ear canal, the tympanic membrane, and the presence, amount, and placement of the cerumen. Gentle water irrigation and soft plastic curette was utilized to remove cerumen.  Post procedure examination: shows cerumen was completely removed. Patient tolerated procedure well. The patient is made aware that they may experience temporary vertigo, temporary hearing loss, and temporary discomfort. If these symptom last for more than 24 hours to call the clinic or proceed to the ED.

## 2017-02-09 LAB — HEMOGLOBIN A1C
HEMOGLOBIN A1C: 5.3 % (ref <5.7)
MEAN PLASMA GLUCOSE: 105 mg/dL

## 2017-03-02 ENCOUNTER — Other Ambulatory Visit: Payer: Self-pay | Admitting: Physician Assistant

## 2017-03-05 ENCOUNTER — Encounter: Payer: Self-pay | Admitting: Internal Medicine

## 2017-04-15 ENCOUNTER — Ambulatory Visit (AMBULATORY_SURGERY_CENTER): Payer: Self-pay | Admitting: *Deleted

## 2017-04-15 VITALS — Ht 62.0 in | Wt 143.0 lb

## 2017-04-15 DIAGNOSIS — Z86010 Personal history of colonic polyps: Secondary | ICD-10-CM

## 2017-04-15 MED ORDER — NA SULFATE-K SULFATE-MG SULF 17.5-3.13-1.6 GM/177ML PO SOLN: 1.0000 | Freq: Once | ORAL | 0 refills | Status: AC

## 2017-04-15 NOTE — Progress Notes (Signed)
No egg or soy allergy known to patient  No issues with past sedation with any surgeries  or procedures, no intubation problems  No diet pills per patient No home 02 use per patient  No blood thinners per patient  Pt denies issues with constipation  No A fib or A flutter  EMMI video sent to pt's e mail - [place] 50 coupon to pt for prep  Fair with movi prep last colon- 2 day prep per JMP-

## 2017-04-22 ENCOUNTER — Encounter: Payer: Self-pay | Admitting: Internal Medicine

## 2017-04-29 ENCOUNTER — Encounter: Payer: Self-pay | Admitting: Internal Medicine

## 2017-04-29 ENCOUNTER — Ambulatory Visit (AMBULATORY_SURGERY_CENTER): Payer: BLUE CROSS/BLUE SHIELD | Admitting: Internal Medicine

## 2017-04-29 VITALS — BP 104/76 | HR 69 | Temp 97.8°F | Resp 13 | Ht 62.0 in | Wt 145.0 lb

## 2017-04-29 DIAGNOSIS — Z86010 Personal history of colonic polyps: Secondary | ICD-10-CM | POA: Diagnosis not present

## 2017-04-29 DIAGNOSIS — D125 Benign neoplasm of sigmoid colon: Secondary | ICD-10-CM

## 2017-04-29 DIAGNOSIS — D122 Benign neoplasm of ascending colon: Secondary | ICD-10-CM | POA: Diagnosis not present

## 2017-04-29 MED ORDER — SODIUM CHLORIDE 0.9 % IV SOLN: 500.0000 mL | INTRAVENOUS | Status: DC

## 2017-04-29 NOTE — Progress Notes (Signed)
Pt's states no medical or surgical changes since previsit or office visit. 

## 2017-04-29 NOTE — Progress Notes (Signed)
Report to PACU, RN, vss, BBS= Clear.  

## 2017-04-29 NOTE — Progress Notes (Signed)
Called to room to assist during endoscopic procedure.  Patient ID and intended procedure confirmed with present staff. Received instructions for my participation in the procedure from the performing physician.  

## 2017-04-29 NOTE — Op Note (Signed)
[facility] Patient Name: Heather Sullivan Procedure Date: 04/29/2017 1:25 PM MRN: [id number] Endoscopist: Jerene Bears , MD Age: 56 Referring MD:  Date of Birth: 10/25/1960 Gender: Female Account #: [id number] Procedure:                Colonoscopy Indications:              Surveillance: Personal history of adenomatous                            polyps on last colonoscopy 3 years ago Medicines:                Monitored Anesthesia Care Procedure:                Pre-Anesthesia Assessment:                           - Prior to the procedure, a History and Physical                            was performed, and patient medications and                            allergies were reviewed. The patient's tolerance of                            previous anesthesia was also reviewed. The risks                            and benefits of the procedure and the sedation                            options and risks were discussed with the patient.                            All questions were answered, and informed consent                            was obtained. Prior Anticoagulants: The patient has                            taken no previous anticoagulant or antiplatelet                            agents. ASA Grade Assessment: II - A patient with                            mild systemic disease. After reviewing the risks                            and benefits, the patient was deemed in                            satisfactory condition to undergo the procedure.  After obtaining informed consent, the colonoscope                            was passed under direct vision. Throughout the                            procedure, the patient's blood pressure, pulse, and                            oxygen saturations were monitored continuously. The                            Model PCF-[id number]) scope was introduced                            through the anus and  advanced to the the cecum,                            identified by appendiceal orifice and ileocecal                            valve. The colonoscopy was performed without                            difficulty. The patient tolerated the procedure                            well. The quality of the bowel preparation was                            good. The ileocecal valve, appendiceal orifice, and                            rectum were photographed. Scope In: 1:37:18 PM Scope Out: 1:52:31 PM Scope Withdrawal Time: 0 hours 12 minutes 37 seconds  Total Procedure Duration: 0 hours 15 minutes 13 seconds  Findings:                 The digital rectal exam was normal.                           A 5 mm polyp was found in the ascending colon. The                            polyp was sessile. The polyp was removed with a                            cold snare. Resection and retrieval were complete.                           A 4 mm polyp was found in the distal sigmoid colon.                            The polyp was  sessile. The polyp was removed with a                            cold snare. Resection and retrieval were complete.                           Multiple small and large-mouthed diverticula were                            found in the sigmoid colon, descending colon and                            hepatic flexure.                           The retroflexed view of the distal rectum and anal                            verge was normal and showed no anal or rectal                            abnormalities. Complications:            No immediate complications. Estimated Blood Loss:     Estimated blood loss was minimal. Impression:               - One 5 mm polyp in the ascending colon, removed                            with a cold snare. Resected and retrieved.                           - One 4 mm polyp in the distal sigmoid colon,                            removed with a cold snare. Resected  and retrieved.                           - Mild diverticulosis in the sigmoid colon, in the                            descending colon and at the hepatic flexure.                           - The distal rectum and anal verge are normal on                            retroflexion view. Recommendation:           - Patient has a contact number available for                            emergencies. The signs and symptoms of potential  delayed complications were discussed with the                            patient. Return to normal activities tomorrow.                            Written discharge instructions were provided to the                            patient.                           - Resume previous diet.                           - Continue present medications.                           - Await pathology results.                           - Repeat colonoscopy is recommended for                            surveillance. The colonoscopy date will be                            determined after pathology results from today's                            exam become available for review. Jerene Bears, MD 04/29/2017 1:56:29 PM This report has been signed electronically.

## 2017-04-29 NOTE — Patient Instructions (Signed)
Discharge instructions given. Handouts on polyps and diverticulosis. Resume previous medications. YOU HAD AN ENDOSCOPIC PROCEDURE TODAY AT THE [facility]:   Refer to the procedure report that was given to you for any specific questions about what was found during the examination.  If the procedure report does not answer your questions, please call your gastroenterologist to clarify.  If you requested that your care partner not be given the details of your procedure findings, then the procedure report has been included in a sealed envelope for you to review at your convenience later.  YOU SHOULD EXPECT: Some feelings of bloating in the abdomen. Passage of more gas than usual.  Walking can help get rid of the air that was put into your GI tract during the procedure and reduce the bloating. If you had a lower endoscopy (such as a colonoscopy or flexible sigmoidoscopy) you may notice spotting of blood in your stool or on the toilet paper. If you underwent a bowel prep for your procedure, you may not have a normal bowel movement for a few days.  Please Note:  You might notice some irritation and congestion in your nose or some drainage.  This is from the oxygen used during your procedure.  There is no need for concern and it should clear up in a day or so.  SYMPTOMS TO REPORT IMMEDIATELY:   Following lower endoscopy (colonoscopy or flexible sigmoidoscopy):  Excessive amounts of blood in the stool  Significant tenderness or worsening of abdominal pains  Swelling of the abdomen that is new, acute  Fever of 100F or higher   For urgent or emergent issues, a gastroenterologist can be reached at any hour by calling [phone].   DIET:  We do recommend a small meal at first, but then you may proceed to your regular diet.  Drink plenty of fluids but you should avoid alcoholic beverages for 24 hours.  ACTIVITY:  You should plan to take it easy for the rest of today and you should NOT  DRIVE or use heavy machinery until tomorrow (because of the sedation medicines used during the test).    FOLLOW UP: Our staff will call the number listed on your records the next business day following your procedure to check on you and address any questions or concerns that you may have regarding the information given to you following your procedure. If we do not reach you, we will leave a message.  However, if you are feeling well and you are not experiencing any problems, there is no need to return our call.  We will assume that you have returned to your regular daily activities without incident.  If any biopsies were taken you will be contacted by phone or by letter within the next 1-3 weeks.  Please call us at [phone] if you have not heard about the biopsies in 3 weeks.    SIGNATURES/CONFIDENTIALITY: You and/or your care partner have signed paperwork which will be entered into your electronic medical record.  These signatures attest to the fact that that the information above on your After Visit Summary has been reviewed and is understood.  Full responsibility of the confidentiality of this discharge information lies with you and/or your care-partner. 

## 2017-04-30 ENCOUNTER — Telehealth: Payer: Self-pay | Admitting: *Deleted

## 2017-04-30 NOTE — Telephone Encounter (Signed)
  Follow up Call-  Call back number 04/29/2017  Post procedure Call Back phone  # [phone]  Permission to leave phone message Yes  Some recent data might be hidden     Patient questions:  Do you have a fever, pain , or abdominal swelling? No. Pain Score  0 *  Have you tolerated food without any problems? Yes.    Have you been able to return to your normal activities? Yes.    Do you have any questions about your discharge instructions: Diet   No. Medications  No. Follow up visit  No.  Do you have questions or concerns about your Care? No.  Actions: * If pain score is 4 or above: No action needed, pain <4.

## 2017-04-30 NOTE — Telephone Encounter (Signed)
Left message on f/u call 

## 2017-05-04 ENCOUNTER — Encounter: Payer: Self-pay | Admitting: Internal Medicine

## 2017-05-16 ENCOUNTER — Encounter: Payer: Self-pay | Admitting: Physician Assistant

## 2017-05-16 DIAGNOSIS — D126 Benign neoplasm of colon, unspecified: Secondary | ICD-10-CM | POA: Insufficient documentation

## 2017-05-21 ENCOUNTER — Ambulatory Visit (INDEPENDENT_AMBULATORY_CARE_PROVIDER_SITE_OTHER): Payer: BLUE CROSS/BLUE SHIELD | Admitting: Physician Assistant

## 2017-05-21 ENCOUNTER — Encounter: Payer: Self-pay | Admitting: Physician Assistant

## 2017-05-21 VITALS — BP 128/69 | HR 69 | Wt 144.0 lb

## 2017-05-21 DIAGNOSIS — F900 Attention-deficit hyperactivity disorder, predominantly inattentive type: Secondary | ICD-10-CM | POA: Diagnosis not present

## 2017-05-21 DIAGNOSIS — Z566 Other physical and mental strain related to work: Secondary | ICD-10-CM | POA: Diagnosis not present

## 2017-05-21 MED ORDER — AMPHETAMINE-DEXTROAMPHET ER 30 MG PO CP24: 30.0000 mg | ORAL_CAPSULE | ORAL | 0 refills | Status: DC

## 2017-05-21 MED ORDER — AMPHETAMINE-DEXTROAMPHETAMINE 10 MG PO TABS: 10.0000 mg | ORAL_TABLET | Freq: Every day | ORAL | 0 refills | Status: DC

## 2017-05-21 MED ORDER — BUPROPION HCL ER (XL) 300 MG PO TB24: 300.0000 mg | ORAL_TABLET | Freq: Every day | ORAL | 3 refills | Status: DC

## 2017-05-21 NOTE — Progress Notes (Signed)
   Subjective:    Patient ID: Heather Sullivan, female    DOB: Jan 27, 1961, 56 y.o.   MRN: [id number]  HPI Pt is a 56 yo female who presents to the clinic for ADHD follow up. She is doing great on her current therapy. She has no concerns or complaints. She is sleeping well and very productive.   She is also doing great on wellbutrin. Work stress is better and she feels good.   .. Active Ambulatory Problems    Diagnosis Date Noted  . Attention deficit disorder 09/14/2007  . WEIGHT GAIN 08/25/2010  . Fecal incontinence 07/25/2015  . Work-related stress 01/01/2016  . Chronic sniffling 02/08/2017  . Left ear impacted cerumen 02/08/2017  . Adenomatous colon polyp 05/16/2017   Resolved Ambulatory Problems    Diagnosis Date Noted  . No Resolved Ambulatory Problems   Past Medical History:  Diagnosis Date  . ADD (attention deficit disorder)   . Diverticulosis       Review of Systems  All other systems reviewed and are negative.      Objective:   Physical Exam  Constitutional: She is oriented to person, place, and time. She appears well-developed and well-nourished.  HENT:  Head: Normocephalic and atraumatic.  Cardiovascular: Normal rate, regular rhythm and normal heart sounds.   Pulmonary/Chest: Effort normal and breath sounds normal.  Neurological: She is alert and oriented to person, place, and time.  Skin: Skin is dry.          Assessment & Plan:  Marland KitchenMarland KitchenRocsi was seen today for adhd.  Diagnoses and all orders for this visit:  Work-related stress -     buPROPion (WELLBUTRIN XL) 300 MG 24 hr tablet; Take 1 tablet (300 mg total) by mouth daily.  Attention deficit hyperactivity disorder (ADHD), predominantly inattentive type -     amphetamine-dextroamphetamine (ADDERALL) 10 MG tablet; Take 1 tablet (10 mg total) by mouth daily. -     amphetamine-dextroamphetamine (ADDERALL XR) 30 MG 24 hr capsule; Take 1 capsule (30 mg total) by mouth every morning.   Follow up in 3  months.

## 2017-06-29 DIAGNOSIS — Z23 Encounter for immunization: Secondary | ICD-10-CM | POA: Diagnosis not present

## 2017-07-03 DIAGNOSIS — Z0100 Encounter for examination of eyes and vision without abnormal findings: Secondary | ICD-10-CM | POA: Diagnosis not present

## 2017-08-23 ENCOUNTER — Encounter: Payer: Self-pay | Admitting: Physician Assistant

## 2017-08-23 ENCOUNTER — Ambulatory Visit: Payer: BLUE CROSS/BLUE SHIELD | Admitting: Physician Assistant

## 2017-08-23 DIAGNOSIS — F900 Attention-deficit hyperactivity disorder, predominantly inattentive type: Secondary | ICD-10-CM

## 2017-08-23 DIAGNOSIS — D225 Melanocytic nevi of trunk: Secondary | ICD-10-CM | POA: Diagnosis not present

## 2017-08-23 DIAGNOSIS — Z1231 Encounter for screening mammogram for malignant neoplasm of breast: Secondary | ICD-10-CM | POA: Diagnosis not present

## 2017-08-23 DIAGNOSIS — D485 Neoplasm of uncertain behavior of skin: Secondary | ICD-10-CM | POA: Diagnosis not present

## 2017-08-23 DIAGNOSIS — Z1283 Encounter for screening for malignant neoplasm of skin: Secondary | ICD-10-CM | POA: Diagnosis not present

## 2017-08-23 DIAGNOSIS — Z803 Family history of malignant neoplasm of breast: Secondary | ICD-10-CM | POA: Diagnosis not present

## 2017-08-23 LAB — HM MAMMOGRAPHY

## 2017-08-23 MED ORDER — AMPHETAMINE-DEXTROAMPHET ER 30 MG PO CP24: 30.0000 mg | ORAL_CAPSULE | ORAL | 0 refills | Status: DC

## 2017-08-23 MED ORDER — AMPHETAMINE-DEXTROAMPHETAMINE 10 MG PO TABS: 10.0000 mg | ORAL_TABLET | Freq: Every day | ORAL | 0 refills | Status: DC

## 2017-08-23 NOTE — Progress Notes (Signed)
   Subjective:    Patient ID: Heather Sullivan, female    DOB: 01-05-1961, 56 y.o.   MRN: [id number]  HPI Pt is a 56 yo female who presents to the clinic for ADD. She is doing great on adderall. No problems or concerns. No anxiety, palpitations, insomnia.   .. Active Ambulatory Problems    Diagnosis Date Noted  . Attention deficit disorder 09/14/2007  . WEIGHT GAIN 08/25/2010  . Fecal incontinence 07/25/2015  . Work-related stress 01/01/2016  . Chronic sniffling 02/08/2017  . Left ear impacted cerumen 02/08/2017  . Adenomatous colon polyp 05/16/2017   Resolved Ambulatory Problems    Diagnosis Date Noted  . No Resolved Ambulatory Problems   Past Medical History:  Diagnosis Date  . ADD (attention deficit disorder)   . Diverticulosis       Review of Systems  All other systems reviewed and are negative.      Objective:   Physical Exam  Constitutional: She is oriented to person, place, and time. She appears well-developed and well-nourished.  HENT:  Head: Normocephalic and atraumatic.  Cardiovascular: Normal rate, regular rhythm and normal heart sounds.  Pulmonary/Chest: Effort normal and breath sounds normal.  Neurological: She is alert and oriented to person, place, and time.  Psychiatric: She has a normal mood and affect. Her behavior is normal.          Assessment & Plan:  Marland KitchenMarland KitchenPayten was seen today for adhd.  Diagnoses and all orders for this visit:  Attention deficit hyperactivity disorder (ADHD), predominantly inattentive type -     amphetamine-dextroamphetamine (ADDERALL XR) 30 MG 24 hr capsule; Take 1 capsule (30 mg total) every morning by mouth. -     amphetamine-dextroamphetamine (ADDERALL) 10 MG tablet; Take 1 tablet (10 mg total) daily by mouth.   [place] controlled substance databases reviewed with no concerns.  Refilled for 3 months.   Will get pap at next visit. No problems or new partners.

## 2017-08-25 DIAGNOSIS — N6002 Solitary cyst of left breast: Secondary | ICD-10-CM | POA: Diagnosis not present

## 2017-08-25 DIAGNOSIS — N6321 Unspecified lump in the left breast, upper outer quadrant: Secondary | ICD-10-CM | POA: Diagnosis not present

## 2017-08-30 ENCOUNTER — Encounter: Payer: Self-pay | Admitting: Physician Assistant

## 2017-08-30 DIAGNOSIS — N6320 Unspecified lump in the left breast, unspecified quadrant: Secondary | ICD-10-CM | POA: Insufficient documentation

## 2017-09-06 DIAGNOSIS — D485 Neoplasm of uncertain behavior of skin: Secondary | ICD-10-CM | POA: Diagnosis not present

## 2017-09-06 DIAGNOSIS — L988 Other specified disorders of the skin and subcutaneous tissue: Secondary | ICD-10-CM | POA: Diagnosis not present

## 2017-09-06 DIAGNOSIS — D2271 Melanocytic nevi of right lower limb, including hip: Secondary | ICD-10-CM | POA: Diagnosis not present

## 2017-10-15 ENCOUNTER — Encounter: Payer: Self-pay | Admitting: Physician Assistant

## 2017-11-12 ENCOUNTER — Other Ambulatory Visit (HOSPITAL_COMMUNITY)
Admission: RE | Admit: 2017-11-12 | Discharge: 2017-11-12 | Disposition: A | Discharge status: Home / Self Care | Payer: BLUE CROSS/BLUE SHIELD | Source: Ambulatory Visit | Attending: Physician Assistant | Admitting: Physician Assistant

## 2017-11-12 ENCOUNTER — Other Ambulatory Visit: Payer: Self-pay | Admitting: *Deleted

## 2017-11-12 ENCOUNTER — Ambulatory Visit: Payer: BLUE CROSS/BLUE SHIELD | Admitting: Physician Assistant

## 2017-11-12 ENCOUNTER — Encounter: Payer: Self-pay | Admitting: Physician Assistant

## 2017-11-12 VITALS — BP 122/75 | HR 69 | Ht 62.0 in | Wt 148.0 lb

## 2017-11-12 DIAGNOSIS — F900 Attention-deficit hyperactivity disorder, predominantly inattentive type: Secondary | ICD-10-CM | POA: Diagnosis not present

## 2017-11-12 DIAGNOSIS — Z01419 Encounter for gynecological examination (general) (routine) without abnormal findings: Secondary | ICD-10-CM | POA: Diagnosis not present

## 2017-11-12 DIAGNOSIS — N952 Postmenopausal atrophic vaginitis: Secondary | ICD-10-CM | POA: Diagnosis not present

## 2017-11-12 MED ORDER — ESTRADIOL 0.1 MG/GM VA CREA: 1.0000 | TOPICAL_CREAM | VAGINAL | 12 refills | Status: DC

## 2017-11-12 MED ORDER — AMPHETAMINE-DEXTROAMPHET ER 30 MG PO CP24: 30.0000 mg | ORAL_CAPSULE | ORAL | 0 refills | Status: DC

## 2017-11-12 MED ORDER — AMPHETAMINE-DEXTROAMPHETAMINE 10 MG PO TABS: 10.0000 mg | ORAL_TABLET | Freq: Every day | ORAL | 0 refills | Status: DC

## 2017-11-12 NOTE — Patient Instructions (Signed)
Estrace vaginal cream- start 2g daily for 2 weeks then taper dose to 1-3 times a week as needed.

## 2017-11-12 NOTE — Progress Notes (Signed)
   Subjective:    Patient ID: Heather Sullivan, female    DOB: 1961-03-28, 57 y.o.   MRN: [id number]  HPI  Pap pain with sex.    Review of Systems  All other systems reviewed and are negative.      Objective:   Physical Exam  Constitutional: She is oriented to person, place, and time. She appears well-developed and well-nourished.  HENT:  Head: Normocephalic and atraumatic.  Cardiovascular: Normal rate, regular rhythm and normal heart sounds.  Pulmonary/Chest: Effort normal and breath sounds normal.  Abdominal: Soft. Bowel sounds are normal. She exhibits no distension and no mass. There is no tenderness. There is no rebound and no guarding.  Genitourinary: No vaginal discharge found.  Genitourinary Comments: Cervical os viewed with no polpys. Exam: with spectulum advancement was tender with some friability around os.  Negative for adnexal tenderness.   Neurological: She is alert and oriented to person, place, and time.  Psychiatric: She has a normal mood and affect. Her behavior is normal.          Assessment & Plan:  Marland KitchenMarland KitchenLavita was seen today for gynecologic exam and adhd.  Diagnoses and all orders for this visit:  Encounter for well woman exam with routine gynecological exam -     Cytology - PAP  Attention deficit hyperactivity disorder (ADHD), predominantly inattentive type  Vaginal atrophy -     estradiol (ESTRACE) 0.1 MG/GM vaginal cream; Place 1 Applicatorful vaginally 3 (three) times a week. For first 2 weeks apply nightly.   Pt declined STD testing. Pt does report pain with intercourse. Discussed vaginal estrogen. Pt agreed. Sent to pharmacy. Discussed nightly for first 2 weeks then taper back to 2-3 times a week.   Follow up as needed.   3 months given on ADHD. metheney refilled due to needing 90 days supply.

## 2017-11-16 LAB — CYTOLOGY - PAP
Diagnosis: NEGATIVE
HPV: NOT DETECTED

## 2017-11-16 NOTE — Progress Notes (Signed)
Call pt: HPV negative. Normal cytology. At this point I think we can lengthen screening interval to 5 years with HPV co testing.

## 2018-01-16 DIAGNOSIS — H6690 Otitis media, unspecified, unspecified ear: Secondary | ICD-10-CM | POA: Diagnosis not present

## 2018-01-16 DIAGNOSIS — H60509 Unspecified acute noninfective otitis externa, unspecified ear: Secondary | ICD-10-CM | POA: Diagnosis not present

## 2018-01-20 DIAGNOSIS — H60509 Unspecified acute noninfective otitis externa, unspecified ear: Secondary | ICD-10-CM | POA: Diagnosis not present

## 2018-02-02 ENCOUNTER — Encounter: Payer: Self-pay | Admitting: Physician Assistant

## 2018-02-02 ENCOUNTER — Ambulatory Visit: Payer: BLUE CROSS/BLUE SHIELD | Admitting: Physician Assistant

## 2018-02-02 VITALS — BP 138/78 | HR 79 | Ht 62.0 in | Wt 146.0 lb

## 2018-02-02 DIAGNOSIS — F900 Attention-deficit hyperactivity disorder, predominantly inattentive type: Secondary | ICD-10-CM | POA: Diagnosis not present

## 2018-02-02 DIAGNOSIS — H9201 Otalgia, right ear: Secondary | ICD-10-CM | POA: Diagnosis not present

## 2018-02-02 DIAGNOSIS — H6981 Other specified disorders of Eustachian tube, right ear: Secondary | ICD-10-CM | POA: Diagnosis not present

## 2018-02-02 DIAGNOSIS — H6121 Impacted cerumen, right ear: Secondary | ICD-10-CM | POA: Diagnosis not present

## 2018-02-02 MED ORDER — AMPHETAMINE-DEXTROAMPHET ER 30 MG PO CP24: 30.0000 mg | ORAL_CAPSULE | ORAL | 0 refills | Status: DC

## 2018-02-02 MED ORDER — HYDROCORTISONE-ACETIC ACID 1-2 % OT SOLN: 5.0000 [drp] | Freq: Two times a day (BID) | OTIC | 0 refills | Status: DC

## 2018-02-02 MED ORDER — AMPHETAMINE-DEXTROAMPHETAMINE 10 MG PO TABS: 10.0000 mg | ORAL_TABLET | Freq: Every day | ORAL | 0 refills | Status: DC

## 2018-02-02 NOTE — Patient Instructions (Signed)
Eustachian Tube Dysfunction The eustachian tube connects the middle ear to the back of the nose. It regulates air pressure in the middle ear by allowing air to move between the ear and nose. It also helps to drain fluid from the middle ear space. When the eustachian tube does not function properly, air pressure, fluid, or both can build up in the middle ear. Eustachian tube dysfunction can affect one or both ears. What are the causes? This condition happens when the eustachian tube becomes blocked or cannot open normally. This may result from:  Ear infections.  Colds and other upper respiratory infections.  Allergies.  Irritation, such as from cigarette smoke or acid from the stomach coming up into the esophagus (gastroesophageal reflux).  Sudden changes in air pressure, such as from descending in an airplane.  Abnormal growths in the nose or throat, such as nasal polyps, tumors, or enlarged tissue at the back of the throat (adenoids).  What increases the risk? This condition may be more likely to develop in people who smoke and people who are overweight. Eustachian tube dysfunction may also be more likely to develop in children, especially children who have:  Certain birth defects of the mouth, such as cleft palate.  Large tonsils and adenoids.  What are the signs or symptoms? Symptoms of this condition may include:  A feeling of fullness in the ear.  Ear pain.  Clicking or popping noises in the ear.  Ringing in the ear.  Hearing loss.  Loss of balance.  Symptoms may get worse when the air pressure around you changes, such as when you travel to an area of high elevation or fly on an airplane. How is this diagnosed? This condition may be diagnosed based on:  Your symptoms.  A physical exam of your ear, nose, and throat.  Tests, such as those that measure: ? The movement of your eardrum (tympanogram). ? Your hearing (audiometry).  How is this treated? Treatment  depends on the cause and severity of your condition. If your symptoms are mild, you may be able to relieve your symptoms by moving air into ("popping") your ears. If you have symptoms of fluid in your ears, treatment may include:  Decongestants.  Antihistamines.  Nasal sprays or ear drops that contain medicines that reduce swelling (steroids).  In some cases, you may need to have a procedure to drain the fluid in your eardrum (myringotomy). In this procedure, a small tube is placed in the eardrum to:  Drain the fluid.  Restore the air in the middle ear space.  Follow these instructions at home:  Take over-the-counter and prescription medicines only as told by your health care provider.  Use techniques to help pop your ears as recommended by your health care provider. These may include: ? Chewing gum. ? Yawning. ? Frequent, forceful swallowing. ? Closing your mouth, holding your nose closed, and gently blowing as if you are trying to blow air out of your nose.  Do not do any of the following until your health care provider approves: ? Travel to high altitudes. ? Fly in airplanes. ? Work in a pressurized cabin or room. ? Scuba dive.  Keep your ears dry. Dry your ears completely after showering or bathing.  Do not smoke.  Keep all follow-up visits as told by your health care provider. This is important. Contact a health care provider if:  Your symptoms do not go away after treatment.  Your symptoms come back after treatment.  You are   unable to pop your ears.  You have: ? A fever. ? Pain in your ear. ? Pain in your head or neck. ? Fluid draining from your ear.  Your hearing suddenly changes.  You become very dizzy.  You lose your balance. This information is not intended to replace advice given to you by your health care provider. Make sure you discuss any questions you have with your health care provider. Document Released: 10/18/2015 Document Revised: 02/27/2016  Document Reviewed: 10/10/2014 Elsevier Interactive Patient Education  2018 Elsevier Inc.  

## 2018-02-02 NOTE — Progress Notes (Signed)
Subjective:    Patient ID: Heather Sullivan, female    DOB: 09-Aug-1961, 58 y.o.   MRN: [id number]  HPI 57 year old pleasant appearing female is here for follow-up of ADD. She is doing well on her current dose of Adderall without any side effects including palpitations or insomnia.  She also complains of ongoing ear pressure. She was seen on 4/14 at [facility] urgent care and diagnosed with a "middle and outer ear infection". She was seen by them again for follow-up and for ear canal irrigation, however she said they were unable to get all of the ear wax out. She still feels like she "is underwater" and there is still pressure in her ear. She denies nasal discharge, nasal congestion, facial pressure, throat pain.  .. Active Ambulatory Problems    Diagnosis Date Noted  . Attention deficit disorder 09/14/2007  . WEIGHT GAIN 08/25/2010  . Fecal incontinence 07/25/2015  . Work-related stress 01/01/2016  . Chronic sniffling 02/08/2017  . Left ear impacted cerumen 02/08/2017  . Adenomatous colon polyp 05/16/2017  . Left breast mass 08/30/2017  . Vaginal atrophy 11/12/2017  . Right ear pain 02/02/2018   Resolved Ambulatory Problems    Diagnosis Date Noted  . No Resolved Ambulatory Problems   Past Medical History:  Diagnosis Date  . ADD (attention deficit disorder)   . Diverticulosis       Review of Systems  All other systems reviewed and are negative.      Objective:   Physical Exam  Constitutional: She is oriented to person, place, and time. She appears well-developed and well-nourished.  HENT:  Head: Normocephalic and atraumatic.  Left Ear: Tympanic membrane and ear canal normal.  Small amount of cerumen in the left ear canal.  Right ear: Moderate amount of cerumen in the ear canal. Unable to visualize TM.  Eyes: Pupils are equal, round, and reactive to light. Conjunctivae and EOM are normal.  Cardiovascular: Normal rate, regular rhythm and normal heart sounds.   Pulmonary/Chest: Effort normal and breath sounds normal.  Neurological: She is alert and oriented to person, place, and time.  Skin: Skin is warm and dry.  Psychiatric: She has a normal mood and affect. Her behavior is normal.       Assessment & Plan:  Marland KitchenMarland KitchenMelayah was seen today for adhd.  Diagnoses and all orders for this visit:  Attention deficit hyperactivity disorder (ADHD), predominantly inattentive type -     amphetamine-dextroamphetamine (ADDERALL XR) 30 MG 24 hr capsule; Take 1 capsule (30 mg total) by mouth every morning. -     amphetamine-dextroamphetamine (ADDERALL) 10 MG tablet; Take 1 tablet (10 mg total) by mouth daily.  Dysfunction of right eustachian tube  Impacted cerumen of right ear  Right ear pain -     acetic acid-hydrocortisone (VOSOL-HC) OTIC solution; Place 5 drops into both ears 2 (two) times daily.   Refilled adderall for 3 months.   Indication: Cerumen impaction of the ear(s)  Medical necessity statement: On physical examination, cerumen impairs clinically significant portions of the external auditory canal, and tympanic membrane. Noted obstructive, copious cerumen that cannot be removed without magnification and instrumentations requiring physician skills Consent: Discussed benefits and risks of procedure and verbal consent obtained Procedure: Patient was prepped for the procedure. Utilized an otoscope to assess and take note of the ear canal, the tympanic membrane, and the presence, amount, and placement of the cerumen. Gentle water irrigation and soft plastic curette was utilized to remove cerumen.  Post  procedure examination: shows cerumen was completely removed. Patient tolerated procedure well. The patient is made aware that they may experience temporary vertigo, temporary hearing loss, and temporary discomfort. If these symptom last for more than 24 hours to call the clinic or proceed to the ED.   Ear canal irrigation of right ear performed in office  with successful removal of cerumen. TM able to be visualized. Pt does have a lot of irritation in right canal. Sent over acetic acid/hydrocortisone drops. Follow up as needed.

## 2018-06-08 ENCOUNTER — Other Ambulatory Visit: Payer: Self-pay | Admitting: Physician Assistant

## 2018-06-08 ENCOUNTER — Encounter: Payer: Self-pay | Admitting: Physician Assistant

## 2018-06-08 ENCOUNTER — Ambulatory Visit: Payer: BLUE CROSS/BLUE SHIELD | Admitting: Physician Assistant

## 2018-06-08 VITALS — BP 134/82 | HR 66 | Ht 62.0 in | Wt 149.0 lb

## 2018-06-08 DIAGNOSIS — Z566 Other physical and mental strain related to work: Secondary | ICD-10-CM

## 2018-06-08 DIAGNOSIS — Z23 Encounter for immunization: Secondary | ICD-10-CM

## 2018-06-08 DIAGNOSIS — F900 Attention-deficit hyperactivity disorder, predominantly inattentive type: Secondary | ICD-10-CM | POA: Diagnosis not present

## 2018-06-08 MED ORDER — AMPHETAMINE-DEXTROAMPHETAMINE 10 MG PO TABS: 10.0000 mg | ORAL_TABLET | Freq: Every day | ORAL | 0 refills | Status: DC

## 2018-06-08 MED ORDER — BUPROPION HCL ER (XL) 300 MG PO TB24: 300.0000 mg | ORAL_TABLET | Freq: Every day | ORAL | 3 refills | Status: DC

## 2018-06-08 MED ORDER — AMPHETAMINE-DEXTROAMPHET ER 30 MG PO CP24: 30.0000 mg | ORAL_CAPSULE | ORAL | 0 refills | Status: DC

## 2018-06-08 NOTE — Progress Notes (Signed)
   Subjective:    Patient ID: Heather Sullivan, female    DOB: [birth year], 57 y.o.   MRN: [id number]  HPI Pt is a 57 yo female who presents to the clinic for 3 month ADHD check.   Pt is not having any problems or concerns today. Doing well on current dose. Denies any anxiety, insomnia, palpitations. Well controlled for years.   Mood is great as well. Continues to take wellbutrin. No SI/HC.   Marland Kitchen. Active Ambulatory Problems    Diagnosis Date Noted  . Attention deficit disorder 09/14/2007  . WEIGHT GAIN 08/25/2010  . Fecal incontinence 07/25/2015  . Work-related stress 01/01/2016  . Chronic sniffling 02/08/2017  . Left ear impacted cerumen 02/08/2017  . Adenomatous colon polyp 05/16/2017  . Left breast mass 08/30/2017  . Vaginal atrophy 11/12/2017  . Right ear pain 02/02/2018   Resolved Ambulatory Problems    Diagnosis Date Noted  . No Resolved Ambulatory Problems   Past Medical History:  Diagnosis Date  . ADD (attention deficit disorder)   . Diverticulosis       Review of Systems  All other systems reviewed and are negative.      Objective:   Physical Exam  Constitutional: She is oriented to person, place, and time. She appears well-developed and well-nourished.  Cardiovascular: Normal rate and regular rhythm.  Pulmonary/Chest: Effort normal and breath sounds normal.  Neurological: She is alert and oriented to person, place, and time.  Psychiatric: She has a normal mood and affect. Her behavior is normal.          Assessment & Plan:  Marland KitchenMarland KitchenDiagnoses and all orders for this visit:  Attention deficit hyperactivity disorder (ADHD), predominantly inattentive type  Work-related stress -     buPROPion (WELLBUTRIN XL) 300 MG 24 hr tablet; Take 1 tablet (300 mg total) by mouth daily.  Need for immunization against influenza -     Flu Vaccine QUAD 36+ mos IM   .Marland Kitchen Depression screen [facility] 2/9 06/08/2018 11/12/2017 05/21/2017 02/08/2017  Decreased Interest 0 0 0 0  Down,  Depressed, Hopeless 0 0 0 0  PHQ - 2 Score 0 0 0 0  Altered sleeping 0 1 - -  Tired, decreased energy 0 0 - -  Change in appetite 1 1 - -  Feeling bad or failure about yourself  0 0 - -  Trouble concentrating 0 0 - -  Moving slowly or fidgety/restless 0 0 - -  Suicidal thoughts 0 0 - -  PHQ-9 Score 1 2 - -  Difficult doing work/chores Not difficult at all Not difficult at all - -   .Marland Kitchen GAD 7 : Generalized Anxiety Score 06/08/2018 11/12/2017  Nervous, Anxious, on Edge 0 0  Control/stop worrying 0 0  Worry too much - different things 0 0  Trouble relaxing 0 0  Restless 0 0  Easily annoyed or irritable 0 0  Afraid - awful might happen 0 0  Total GAD 7 Score 0 0  Anxiety Difficulty - Not difficult at all   2nd BP check looks good.   Refilled wellbutrin. Sent adderall to Dr. Madilyn Fireman to approve 90 day supply.  Pt is compliant with no concerns. Ok to go 6 months.

## 2018-06-08 NOTE — Telephone Encounter (Signed)
Here today for follow up. Request 90 days. Thanks.

## 2018-08-26 DIAGNOSIS — Z1231 Encounter for screening mammogram for malignant neoplasm of breast: Secondary | ICD-10-CM | POA: Diagnosis not present

## 2018-08-26 DIAGNOSIS — Z803 Family history of malignant neoplasm of breast: Secondary | ICD-10-CM | POA: Diagnosis not present

## 2018-08-26 LAB — HM MAMMOGRAPHY

## 2018-09-06 ENCOUNTER — Other Ambulatory Visit: Payer: Self-pay

## 2018-09-06 DIAGNOSIS — F900 Attention-deficit hyperactivity disorder, predominantly inattentive type: Secondary | ICD-10-CM

## 2018-09-06 NOTE — Telephone Encounter (Signed)
Can you please send this pended adderall for a 3 month supply?

## 2018-09-06 NOTE — Telephone Encounter (Signed)
Heather Sullivan called today saying that she is in need of a refill on her Adderall. She was last seen on 06/08/2018. She said at her last appointment, you told her she could see you every 6 months, instead of every 3 months. I have pended the medication for refill. Thanks!

## 2018-09-07 MED ORDER — AMPHETAMINE-DEXTROAMPHET ER 30 MG PO CP24: 30.0000 mg | ORAL_CAPSULE | ORAL | 0 refills | Status: DC

## 2018-09-22 ENCOUNTER — Encounter: Payer: Self-pay | Admitting: Physician Assistant

## 2018-10-10 DIAGNOSIS — L738 Other specified follicular disorders: Secondary | ICD-10-CM | POA: Diagnosis not present

## 2018-10-10 DIAGNOSIS — L821 Other seborrheic keratosis: Secondary | ICD-10-CM | POA: Diagnosis not present

## 2018-10-10 DIAGNOSIS — Z1283 Encounter for screening for malignant neoplasm of skin: Secondary | ICD-10-CM | POA: Diagnosis not present

## 2018-12-08 ENCOUNTER — Other Ambulatory Visit: Payer: Self-pay | Admitting: Physician Assistant

## 2018-12-08 DIAGNOSIS — F900 Attention-deficit hyperactivity disorder, predominantly inattentive type: Secondary | ICD-10-CM

## 2018-12-09 MED ORDER — AMPHETAMINE-DEXTROAMPHET ER 30 MG PO CP24: 30.0000 mg | ORAL_CAPSULE | ORAL | 0 refills | Status: DC

## 2018-12-09 NOTE — Telephone Encounter (Signed)
I will go ahead and fill but she needs to go ahead and make an appointment for end of this month or first week of April to get her next refill.

## 2018-12-09 NOTE — Telephone Encounter (Signed)
Pt advised.  She will call back to schedule.

## 2019-01-03 ENCOUNTER — Telehealth: Payer: Self-pay | Admitting: Physician Assistant

## 2019-01-03 NOTE — Telephone Encounter (Signed)
Patient calls and request a refill on her Adderall.

## 2019-01-03 NOTE — Telephone Encounter (Signed)
Will need virtual visit and she MUST get a BP and HR.

## 2019-01-03 NOTE — Telephone Encounter (Signed)
Given to [place] up front and appointment made with [facility] as virtual visit. KG LPN

## 2019-01-06 ENCOUNTER — Other Ambulatory Visit: Payer: Self-pay | Admitting: Physician Assistant

## 2019-01-06 ENCOUNTER — Encounter: Payer: Self-pay | Admitting: Physician Assistant

## 2019-01-06 ENCOUNTER — Ambulatory Visit (INDEPENDENT_AMBULATORY_CARE_PROVIDER_SITE_OTHER): Payer: BLUE CROSS/BLUE SHIELD | Admitting: Physician Assistant

## 2019-01-06 DIAGNOSIS — F900 Attention-deficit hyperactivity disorder, predominantly inattentive type: Secondary | ICD-10-CM | POA: Diagnosis not present

## 2019-01-06 MED ORDER — AMPHETAMINE-DEXTROAMPHET ER 30 MG PO CP24: 30.0000 mg | ORAL_CAPSULE | ORAL | 0 refills | Status: DC

## 2019-01-06 NOTE — Telephone Encounter (Signed)
Pt seen virtually today.  Marland Kitchen.PDMP reviewed during this encounter. No concerns.  Request 90 day supply.  Pended below.

## 2019-01-06 NOTE — Progress Notes (Signed)
   Subjective:    Patient ID: Heather Sullivan, female    DOB: [birth year], 58 y.o.   MRN: [id number]  HPI  Pt is a 58 yo female with ADD who calls in via webex to discuss medication refill.   Pt is doing great. No problems or concerns. She is taking XR daily and as needed IR when needed. She does not need a refill on IR. No CP, palpitations, headaches, anxiety. Doing well at work with no problems with focus.   .. Active Ambulatory Problems    Diagnosis Date Noted  . Attention deficit disorder 09/14/2007  . WEIGHT GAIN 08/25/2010  . Fecal incontinence 07/25/2015  . Work-related stress 01/01/2016  . Chronic sniffling 02/08/2017  . Left ear impacted cerumen 02/08/2017  . Adenomatous colon polyp 05/16/2017  . Left breast mass 08/30/2017  . Vaginal atrophy 11/12/2017  . Right ear pain 02/02/2018   Resolved Ambulatory Problems    Diagnosis Date Noted  . No Resolved Ambulatory Problems   Past Medical History:  Diagnosis Date  . ADD (attention deficit disorder)   . Diverticulosis      Review of Systems  All other systems reviewed and are negative.      Objective:   Physical Exam Vitals signs reviewed.  Constitutional:      Appearance: Normal appearance.  HENT:     Head: Normocephalic.  Cardiovascular:     Rate and Rhythm: Normal rate.  Pulmonary:     Effort: Pulmonary effort is normal.  Neurological:     General: No focal deficit present.     Mental Status: She is alert and oriented to person, place, and time.  Psychiatric:        Mood and Affect: Mood normal.           Assessment & Plan:  Marland KitchenMarland KitchenDiagnoses and all orders for this visit:  Attention deficit hyperactivity disorder (ADHD), predominantly inattentive type   Ok for 90 day supply.  ..PDMP reviewed during this encounter. Pended for supervising physician to review and send due to 90 day supply.  6 month follow up.

## 2019-01-10 ENCOUNTER — Telehealth: Payer: Self-pay | Admitting: Neurology

## 2019-01-10 NOTE — Telephone Encounter (Signed)
Received vm from patient stating [facility] did not have her Adderall RX. It looks like this was sent on Friday. I called [facility]. They confirmed they had it, but did not fill it. They will get it ready for patient now. I called patient and let her know.

## 2019-01-11 ENCOUNTER — Telehealth: Payer: Self-pay | Admitting: Physician Assistant

## 2019-01-11 NOTE — Telephone Encounter (Signed)
Yes on 4/3 90 capsules was sent electronically to [facility], please alert patient and call pharmacy if needed.

## 2019-01-11 NOTE — Telephone Encounter (Signed)
Patient advised.

## 2019-01-11 NOTE — Telephone Encounter (Signed)
I had an appointment with Heather Sullivan on Friday and I need a refill on my aderrall which has not been called in yet.  Please ask her to have this called in today.  Thank you.

## 2019-01-12 NOTE — Telephone Encounter (Signed)
Thank You.

## 2019-04-03 ENCOUNTER — Other Ambulatory Visit: Payer: Self-pay | Admitting: Neurology

## 2019-04-03 DIAGNOSIS — F900 Attention-deficit hyperactivity disorder, predominantly inattentive type: Secondary | ICD-10-CM

## 2019-04-03 MED ORDER — AMPHETAMINE-DEXTROAMPHET ER 30 MG PO CP24: 30.0000 mg | ORAL_CAPSULE | ORAL | 0 refills | Status: DC

## 2019-04-03 NOTE — Telephone Encounter (Signed)
Need to sign for 3 month supply. Pt is very controlled no changes of doses or issues in years. See her every 6 months. Please refill.

## 2019-04-03 NOTE — Telephone Encounter (Signed)
Patient left vm asking for Adderall to be sent to pharmacy. Last refill 01/06/2019. Last visit same day and told to follow up in 6 months. Please advise on refill.

## 2019-04-14 DIAGNOSIS — Z03818 Encounter for observation for suspected exposure to other biological agents ruled out: Secondary | ICD-10-CM | POA: Diagnosis not present

## 2019-06-27 ENCOUNTER — Other Ambulatory Visit: Payer: Self-pay | Admitting: Physician Assistant

## 2019-06-27 DIAGNOSIS — Z566 Other physical and mental strain related to work: Secondary | ICD-10-CM

## 2019-07-04 ENCOUNTER — Ambulatory Visit: Payer: BLUE CROSS/BLUE SHIELD | Admitting: Physician Assistant

## 2019-07-04 ENCOUNTER — Other Ambulatory Visit: Payer: Self-pay

## 2019-07-04 ENCOUNTER — Other Ambulatory Visit: Payer: Self-pay | Admitting: Physician Assistant

## 2019-07-04 ENCOUNTER — Encounter: Payer: Self-pay | Admitting: Physician Assistant

## 2019-07-04 VITALS — BP 120/78 | HR 73 | Ht 62.0 in | Wt 139.0 lb

## 2019-07-04 DIAGNOSIS — Z23 Encounter for immunization: Secondary | ICD-10-CM | POA: Diagnosis not present

## 2019-07-04 DIAGNOSIS — F900 Attention-deficit hyperactivity disorder, predominantly inattentive type: Secondary | ICD-10-CM | POA: Diagnosis not present

## 2019-07-04 DIAGNOSIS — Z566 Other physical and mental strain related to work: Secondary | ICD-10-CM | POA: Diagnosis not present

## 2019-07-04 MED ORDER — AMPHETAMINE-DEXTROAMPHET ER 30 MG PO CP24: 30.0000 mg | ORAL_CAPSULE | ORAL | 0 refills | Status: DC

## 2019-07-04 MED ORDER — BUPROPION HCL ER (XL) 300 MG PO TB24: 300.0000 mg | ORAL_TABLET | Freq: Every day | ORAL | 3 refills | Status: DC

## 2019-07-04 NOTE — Telephone Encounter (Signed)
Pt seen for visit today. Will you send 90 supply to pharmacy. Doing great.

## 2019-07-04 NOTE — Progress Notes (Signed)
Subjective:    Patient ID: Heather Sullivan, female    DOB: 1960-10-15, 58 y.o.   MRN: [id number]  HPI  Pt is a 58 yo female with ADD and work stress who presents to the clinic for follow up.   She is doing great. No problems with medication. Mood is great. No SI/HC. She denies any insomnia, headaches, palpitations, anxiety problems. Needs refills.   .. Active Ambulatory Problems    Diagnosis Date Noted  . Attention deficit disorder 09/14/2007  . WEIGHT GAIN 08/25/2010  . Fecal incontinence 07/25/2015  . Work-related stress 01/01/2016  . Chronic sniffling 02/08/2017  . Left ear impacted cerumen 02/08/2017  . Adenomatous colon polyp 05/16/2017  . Left breast mass 08/30/2017  . Vaginal atrophy 11/12/2017  . Right ear pain 02/02/2018   Resolved Ambulatory Problems    Diagnosis Date Noted  . No Resolved Ambulatory Problems   Past Medical History:  Diagnosis Date  . ADD (attention deficit disorder)   . Diverticulosis     Review of Systems  All other systems reviewed and are negative.      Objective:   Physical Exam Vitals signs reviewed.  Cardiovascular:     Rate and Rhythm: Normal rate and regular rhythm.     Pulses: Normal pulses.  Pulmonary:     Effort: Pulmonary effort is normal.     Breath sounds: Normal breath sounds.  Neurological:     General: No focal deficit present.     Mental Status: She is alert and oriented to person, place, and time.  Psychiatric:        Mood and Affect: Mood normal.       .. Depression screen [facility] 2/9 07/04/2019 06/08/2018 11/12/2017 05/21/2017 02/08/2017  Decreased Interest 0 0 0 0 0  Down, Depressed, Hopeless 0 0 0 0 0  PHQ - 2 Score 0 0 0 0 0  Altered sleeping 0 0 1 - -  Tired, decreased energy 0 0 0 - -  Change in appetite 0 1 1 - -  Feeling bad or failure about yourself  0 0 0 - -  Trouble concentrating 0 0 0 - -  Moving slowly or fidgety/restless 0 0 0 - -  Suicidal thoughts 0 0 0 - -  PHQ-9 Score 0 1 2 - -  Difficult  doing work/chores Not difficult at all Not difficult at all Not difficult at all - -   .Marland Kitchen GAD 7 : Generalized Anxiety Score 07/04/2019 06/08/2018 11/12/2017  Nervous, Anxious, on Edge 0 0 0  Control/stop worrying 0 0 0  Worry too much - different things 0 0 0  Trouble relaxing 0 0 0  Restless 0 0 0  Easily annoyed or irritable 0 0 0  Afraid - awful might happen 0 0 0  Total GAD 7 Score 0 0 0  Anxiety Difficulty Not difficult at all - Not difficult at all        Assessment & Plan:  Marland KitchenMarland KitchenKayle was seen today for adhd.  Diagnoses and all orders for this visit:  Attention deficit hyperactivity disorder (ADHD), predominantly inattentive type  Flu vaccine need -     Flu Vaccine QUAD 36+ mos IM  Work-related stress -     buPROPion (WELLBUTRIN XL) 300 MG 24 hr tablet; Take 1 tablet (300 mg total) by mouth daily.   Refilled 90 day adderall. Ok for 6 months follow up. Doing great.   Mood doing great. Refilled wellbutrin for 1 year.  Needs CPE at next visit. Come fasting for labs.

## 2019-08-19 DIAGNOSIS — B029 Zoster without complications: Secondary | ICD-10-CM | POA: Diagnosis not present

## 2019-08-19 DIAGNOSIS — M79602 Pain in left arm: Secondary | ICD-10-CM | POA: Diagnosis not present

## 2019-08-25 DIAGNOSIS — B029 Zoster without complications: Secondary | ICD-10-CM | POA: Diagnosis not present

## 2019-08-25 DIAGNOSIS — M79602 Pain in left arm: Secondary | ICD-10-CM | POA: Diagnosis not present

## 2019-09-06 LAB — HM MAMMOGRAPHY

## 2019-09-14 ENCOUNTER — Encounter: Payer: Self-pay | Admitting: Physician Assistant

## 2019-10-01 ENCOUNTER — Other Ambulatory Visit: Payer: Self-pay | Admitting: Physician Assistant

## 2019-10-01 DIAGNOSIS — Z566 Other physical and mental strain related to work: Secondary | ICD-10-CM

## 2019-10-03 ENCOUNTER — Other Ambulatory Visit: Payer: Self-pay | Admitting: Neurology

## 2019-10-03 DIAGNOSIS — F900 Attention-deficit hyperactivity disorder, predominantly inattentive type: Secondary | ICD-10-CM

## 2019-10-03 MED ORDER — AMPHETAMINE-DEXTROAMPHET ER 30 MG PO CP24: 30.0000 mg | ORAL_CAPSULE | ORAL | 0 refills | Status: DC

## 2019-10-03 NOTE — Telephone Encounter (Signed)
Seen in September, was told 6 month follow up okay. Please send 90 day RX of Adderall. Patient called for refill.

## 2019-10-03 NOTE — Telephone Encounter (Signed)
ADD follow up she has been stable on same dose for years.I see her every 6 months. Will you send her next 90 day rx.

## 2019-12-14 ENCOUNTER — Other Ambulatory Visit: Payer: Self-pay

## 2019-12-14 ENCOUNTER — Ambulatory Visit: Payer: BLUE CROSS/BLUE SHIELD | Attending: Internal Medicine

## 2019-12-14 DIAGNOSIS — Z23 Encounter for immunization: Secondary | ICD-10-CM

## 2019-12-14 NOTE — Progress Notes (Signed)
   Covid-19 Vaccination Clinic  Name:  Heather Sullivan    MRN: [id number] DOB: June 29, 1961  12/14/2019  [name] was observed post Covid-19 immunization for 15 minutes without incident. She was provided with Vaccine Information Sheet and instruction to access the V-Safe system.   [name] was instructed to call 911 with any severe reactions post vaccine: Marland Kitchen Difficulty breathing  . Swelling of face and throat  . A fast heartbeat  . A bad rash all over body  . Dizziness and weakness   Immunizations Administered    Name Date Dose VIS Date Route   Pfizer COVID-19 Vaccine 12/14/2019  8:57 AM 0.3 mL 09/15/2019 Intramuscular   Manufacturer: [place]   Lot: [id number]   [place]: [id number]

## 2020-01-10 ENCOUNTER — Ambulatory Visit: Payer: BLUE CROSS/BLUE SHIELD | Attending: Internal Medicine

## 2020-01-10 DIAGNOSIS — Z23 Encounter for immunization: Secondary | ICD-10-CM

## 2020-01-10 NOTE — Progress Notes (Signed)
   Covid-19 Vaccination Clinic  Name:  Heather Sullivan    MRN: [id number] DOB: 02-Feb-1961  01/10/2020  [name] was observed post Covid-19 immunization for 15 minutes without incident. She was provided with Vaccine Information Sheet and instruction to access the V-Safe system.   [name] was instructed to call 911 with any severe reactions post vaccine: Marland Kitchen Difficulty breathing  . Swelling of face and throat  . A fast heartbeat  . A bad rash all over body  . Dizziness and weakness   Immunizations Administered    Name Date Dose VIS Date Route   Pfizer COVID-19 Vaccine 01/10/2020  8:58 AM 0.3 mL 09/15/2019 Intramuscular   Manufacturer: [place]   Lot: [id number]   [place]: [id number]

## 2020-01-30 ENCOUNTER — Ambulatory Visit: Payer: BLUE CROSS/BLUE SHIELD | Admitting: Medical-Surgical

## 2020-01-30 ENCOUNTER — Other Ambulatory Visit: Payer: Self-pay

## 2020-01-30 ENCOUNTER — Encounter: Payer: Self-pay | Admitting: Medical-Surgical

## 2020-01-30 VITALS — BP 130/84 | HR 74 | Temp 98.4°F | Ht 62.0 in | Wt 137.0 lb

## 2020-01-30 DIAGNOSIS — Z23 Encounter for immunization: Secondary | ICD-10-CM

## 2020-01-30 DIAGNOSIS — F900 Attention-deficit hyperactivity disorder, predominantly inattentive type: Secondary | ICD-10-CM

## 2020-01-30 MED ORDER — AMPHETAMINE-DEXTROAMPHETAMINE 10 MG PO TABS: ORAL_TABLET | ORAL | 0 refills | Status: DC

## 2020-01-30 MED ORDER — AMPHETAMINE-DEXTROAMPHET ER 30 MG PO CP24: 30.0000 mg | ORAL_CAPSULE | ORAL | 0 refills | Status: DC

## 2020-01-30 NOTE — Progress Notes (Signed)
Subjective:    CC: ADD follow-up  HPI: Pleasant 59 year old female presenting for ADD follow-up.  Taking Adderall 30 mg every morning with a 10 mg IR dose as needed in the afternoons.  Reports she is taking this as needed dose approximately 2-3 times per week.  Does admit to being 4 weeks late for her follow-up and so she is only been taking 10 mg/day because her prescription ran out.  Tolerating the medication well, no side effects.  No difficulty with sleeping.  Denies fever, chills, chest pain, palpitations, shortness of breath.  I reviewed the past medical history, family history, social history, surgical history, and allergies today and no changes were needed.  Please see the problem list section below in epic for further details.  Past Medical History: Past Medical History:  Diagnosis Date  . ADD (attention deficit disorder)   . Diverticulosis    Past Surgical History: Past Surgical History:  Procedure Laterality Date  . [place]  . COLONOSCOPY    . FEMORAL HERNIA REPAIR  1981  . POLYPECTOMY    . TONSILLECTOMY  1967   Social History: Social History   Socioeconomic History  . Marital status: Married    Spouse name: Not on file  . Number of children: Not on file  . Years of education: Not on file  . Highest education level: Not on file  Occupational History  . Not on file  Tobacco Use  . Smoking status: Never Smoker  . Smokeless tobacco: Never Used  Substance and Sexual Activity  . Alcohol use: Yes    Comment: rare alcohol intake  . Drug use: No  . Sexual activity: Yes    Birth control/protection: Condom  Other Topics Concern  . Not on file  Social History Narrative  . Not on file   Social Determinants of Health   Financial Resource Strain:   . Difficulty of Paying Living Expenses:   Food Insecurity:   . Worried About Charity fundraiser in the Last Year:   . Arboriculturist in the Last Year:   Transportation Needs:   . Lexicographer (Medical):   Marland Kitchen Lack of Transportation (Non-Medical):   Physical Activity:   . Days of Exercise per Week:   . Minutes of Exercise per Session:   Stress:   . Feeling of Stress :   Social Connections:   . Frequency of Communication with Friends and Family:   . Frequency of Social Gatherings with Friends and Family:   . Attends Religious Services:   . Active Member of Clubs or Organizations:   . Attends Archivist Meetings:   Marland Kitchen Marital Status:    Family History: Family History  Problem Relation Age of Onset  . Cancer Mother        postmenopausal CA/breast  . Colon cancer Neg Hx   . Colon polyps Neg Hx   . Esophageal cancer Neg Hx   . Rectal cancer Neg Hx   . Stomach cancer Neg Hx    Allergies: Allergies  Allergen Reactions  . Vitamin E Swelling    Liquid vitamin E made lips swell    Medications: See med rec.  Review of Systems: No fevers, chills, night sweats, weight loss, chest pain, or shortness of breath.   Objective:    General: Well Developed, well nourished, and in no acute distress.  Neuro: Alert and oriented x3.  HEENT: Normocephalic, atraumatic.  Skin: Warm and dry. Cardiac:  Regular rate and rhythm, no murmurs rubs or gallops, no lower extremity edema.  Respiratory: Clear to auscultation bilaterally. Not using accessory muscles, speaking in full sentences.   Impression and Recommendations:    1. Attention deficit hyperactivity disorder (ADHD), predominantly inattentive type Continue Adderall XR 30mg  daily with as needed Adderall IR 10mg  in the afternoon. Refills sent to pharmacy for 3 months. - amphetamine-dextroamphetamine (ADDERALL XR) 30 MG 24 hr capsule; Take 1 capsule (30 mg total) by mouth every morning.  Dispense: 30 capsule; Refill: 0 - amphetamine-dextroamphetamine (ADDERALL) 10 MG tablet; TAKE 1 TABLET IN THE AFTERNOON AS NEEDED  Dispense: 30 tablet; Refill: 0 - amphetamine-dextroamphetamine (ADDERALL XR) 30 MG 24 hr capsule;  Take 1 capsule (30 mg total) by mouth every morning.  Dispense: 30 capsule; Refill: 0 - amphetamine-dextroamphetamine (ADDERALL XR) 30 MG 24 hr capsule; Take 1 capsule (30 mg total) by mouth every morning.  Dispense: 30 capsule; Refill: 0  2. Need for Tdap vaccination Tdap given in office by MA. - Tdap vaccine greater than or equal to 7yo IM  Return in about 6 months (around 07/31/2020) for ADD followup. ___________________________________________ Clearnce Sorrel, DNP, APRN, FNP-BC [facility] [place]

## 2020-05-03 ENCOUNTER — Other Ambulatory Visit: Payer: Self-pay

## 2020-05-03 DIAGNOSIS — F900 Attention-deficit hyperactivity disorder, predominantly inattentive type: Secondary | ICD-10-CM

## 2020-05-03 MED ORDER — AMPHETAMINE-DEXTROAMPHET ER 30 MG PO CP24: 30.0000 mg | ORAL_CAPSULE | ORAL | 0 refills | Status: DC

## 2020-05-03 NOTE — Telephone Encounter (Signed)
Follow-up due in October

## 2020-06-05 ENCOUNTER — Other Ambulatory Visit: Payer: Self-pay | Admitting: Neurology

## 2020-06-05 DIAGNOSIS — F900 Attention-deficit hyperactivity disorder, predominantly inattentive type: Secondary | ICD-10-CM

## 2020-06-05 MED ORDER — AMPHETAMINE-DEXTROAMPHET ER 30 MG PO CP24: 30.0000 mg | ORAL_CAPSULE | ORAL | 0 refills | Status: DC

## 2020-06-05 NOTE — Telephone Encounter (Signed)
Patient made aware.

## 2020-06-05 NOTE — Telephone Encounter (Signed)
Patient left vm for refill on Adderall XR. Last written 05/03/2020 #30 with no refills. Last seen 01/30/2020 and told to follow up in 6 months. Refill pended with note that patient needs appt for further refills.

## 2020-07-04 ENCOUNTER — Other Ambulatory Visit: Payer: Self-pay | Admitting: Physician Assistant

## 2020-07-04 DIAGNOSIS — Z566 Other physical and mental strain related to work: Secondary | ICD-10-CM

## 2020-07-09 ENCOUNTER — Other Ambulatory Visit: Payer: Self-pay | Admitting: Neurology

## 2020-07-09 DIAGNOSIS — F900 Attention-deficit hyperactivity disorder, predominantly inattentive type: Secondary | ICD-10-CM

## 2020-07-09 NOTE — Telephone Encounter (Signed)
Patient called and left vm stating she could not get a follow up appt until 07/24/2020 and needs Adderall refill. Asking for refill until appt.   Last visit 01/30/2020 with Joy.   Refilled 06/05/2020 #30 with note that states appt for further refills.   Please advise.

## 2020-07-10 MED ORDER — AMPHETAMINE-DEXTROAMPHET ER 30 MG PO CP24: 30.0000 mg | ORAL_CAPSULE | ORAL | 0 refills | Status: DC

## 2020-07-24 ENCOUNTER — Other Ambulatory Visit: Payer: Self-pay

## 2020-07-24 ENCOUNTER — Ambulatory Visit (INDEPENDENT_AMBULATORY_CARE_PROVIDER_SITE_OTHER): Payer: BC Managed Care – PPO | Admitting: Physician Assistant

## 2020-07-24 ENCOUNTER — Other Ambulatory Visit: Payer: Self-pay | Admitting: Physician Assistant

## 2020-07-24 ENCOUNTER — Encounter: Payer: Self-pay | Admitting: Physician Assistant

## 2020-07-24 VITALS — BP 132/79 | HR 70 | Ht 62.0 in | Wt 137.0 lb

## 2020-07-24 DIAGNOSIS — Z23 Encounter for immunization: Secondary | ICD-10-CM | POA: Diagnosis not present

## 2020-07-24 DIAGNOSIS — F900 Attention-deficit hyperactivity disorder, predominantly inattentive type: Secondary | ICD-10-CM

## 2020-07-24 DIAGNOSIS — Z566 Other physical and mental strain related to work: Secondary | ICD-10-CM | POA: Diagnosis not present

## 2020-07-24 DIAGNOSIS — Z131 Encounter for screening for diabetes mellitus: Secondary | ICD-10-CM

## 2020-07-24 DIAGNOSIS — Z13220 Encounter for screening for lipoid disorders: Secondary | ICD-10-CM

## 2020-07-24 MED ORDER — BUPROPION HCL ER (XL) 300 MG PO TB24: 300.0000 mg | ORAL_TABLET | Freq: Every day | ORAL | 3 refills | Status: DC

## 2020-07-24 NOTE — Telephone Encounter (Signed)
Seen 10/20. Doing great. Request 90 day refill.

## 2020-07-24 NOTE — Progress Notes (Signed)
Subjective:    Patient ID: Heather Sullivan, female    DOB: May 17, 1961, 59 y.o.   MRN: [id number]  HPI  Pt is a 59 yo female with ADHD who presents to the clinic for medication refills.   She is doing wonderful. No concerns or complaints. Pt is doing great with dose. No problems at work. Mood is great.   .. Active Ambulatory Problems    Diagnosis Date Noted  . Attention deficit disorder 09/14/2007  . WEIGHT GAIN 08/25/2010  . Fecal incontinence 07/25/2015  . Work-related stress 01/01/2016  . Chronic sniffling 02/08/2017  . Left ear impacted cerumen 02/08/2017  . Adenomatous colon polyp 05/16/2017  . Left breast mass 08/30/2017  . Vaginal atrophy 11/12/2017  . Right ear pain 02/02/2018   Resolved Ambulatory Problems    Diagnosis Date Noted  . No Resolved Ambulatory Problems   Past Medical History:  Diagnosis Date  . ADD (attention deficit disorder)   . Diverticulosis       Review of Systems  All other systems reviewed and are negative.      Objective:   Physical Exam Vitals reviewed.  Constitutional:      Appearance: Normal appearance.  Cardiovascular:     Rate and Rhythm: Normal rate and regular rhythm.     Pulses: Normal pulses.  Pulmonary:     Effort: Pulmonary effort is normal.     Breath sounds: Normal breath sounds.  Neurological:     General: No focal deficit present.     Mental Status: She is alert and oriented to person, place, and time.  Psychiatric:        Mood and Affect: Mood normal.    .. Depression screen [facility] 2/9 07/24/2020 07/04/2019 06/08/2018 11/12/2017 05/21/2017  Decreased Interest 0 0 0 0 0  Down, Depressed, Hopeless 0 0 0 0 0  PHQ - 2 Score 0 0 0 0 0  Altered sleeping 0 0 0 1 -  Tired, decreased energy 0 0 0 0 -  Change in appetite 0 0 1 1 -  Feeling bad or failure about yourself  0 0 0 0 -  Trouble concentrating 0 0 0 0 -  Moving slowly or fidgety/restless 0 0 0 0 -  Suicidal thoughts 0 0 0 0 -  PHQ-9 Score 0 0 1 2 -  Difficult  doing work/chores Not difficult at all Not difficult at all Not difficult at all Not difficult at all -   .. GAD 7 : Generalized Anxiety Score 07/24/2020 07/04/2019 06/08/2018 11/12/2017  Nervous, Anxious, on Edge 0 0 0 0  Control/stop worrying 0 0 0 0  Worry too much - different things 0 0 0 0  Trouble relaxing 0 0 0 0  Restless 0 0 0 0  Easily annoyed or irritable 1 0 0 0  Afraid - awful might happen 0 0 0 0  Total GAD 7 Score 1 0 0 0  Anxiety Difficulty Not difficult at all Not difficult at all - Not difficult at all           Assessment & Plan:  Marland KitchenMarland KitchenRhyli was seen today for adhd.  Diagnoses and all orders for this visit:  Attention deficit hyperactivity disorder (ADHD), predominantly inattentive type -     CBC  Work-related stress -     buPROPion (WELLBUTRIN XL) 300 MG 24 hr tablet; Take 1 tablet (300 mg total) by mouth daily. -     CBC  Flu vaccine need -  Flu Vaccine QUAD 36+ mos IM  Screening for diabetes mellitus -     COMPLETE METABOLIC PANEL WITH GFR  Screening for lipid disorders -     Lipid Panel w/reflex Direct LDL  Need for shingles vaccine -     Varicella-zoster vaccine IM   Refilled Adderall for 3 months. Follow up in 6 months.   Mood stable wellbutrin refilled.   shingrix follow up 6 months 2nd shot.

## 2020-07-24 NOTE — Patient Instructions (Signed)
Zoster Vaccine, Recombinant injection What is this medicine? ZOSTER VACCINE (ZOS ter vak SEEN) is used to prevent shingles in adults 60 years old and over. This vaccine is not used to treat shingles or nerve pain from shingles. This medicine may be used for other purposes; ask your health care provider or pharmacist if you have questions. COMMON BRAND NAME(S): SHINGRIX What should I tell my health care provider before I take this medicine? They need to know if you have any of these conditions:  blood disorders or disease  cancer like leukemia or lymphoma  immune system problems or therapy  an unusual or allergic reaction to vaccines, other medications, foods, dyes, or preservatives  pregnant or trying to get pregnant  breast-feeding How should I use this medicine? This vaccine is for injection in a muscle. It is given by a health care professional. Talk to your pediatrician regarding the use of this medicine in children. This medicine is not approved for use in children. Overdosage: If you think you have taken too much of this medicine contact a poison control center or emergency room at once. NOTE: This medicine is only for you. Do not share this medicine with others. What if I miss a dose? Keep appointments for follow-up (booster) doses as directed. It is important not to miss your dose. Call your doctor or health care professional if you are unable to keep an appointment. What may interact with this medicine?  medicines that suppress your immune system  medicines to treat cancer  steroid medicines like prednisone or cortisone This list may not describe all possible interactions. Give your health care provider a list of all the medicines, herbs, non-prescription drugs, or dietary supplements you use. Also tell them if you smoke, drink alcohol, or use illegal drugs. Some items may interact with your medicine. What should I watch for while using this medicine? Visit your doctor for  regular check ups. This vaccine, like all vaccines, may not fully protect everyone. What side effects may I notice from receiving this medicine? Side effects that you should report to your doctor or health care professional as soon as possible:  allergic reactions like skin rash, itching or hives, swelling of the face, lips, or tongue  breathing problems Side effects that usually do not require medical attention (report these to your doctor or health care professional if they continue or are bothersome):  chills  headache  fever  nausea, vomiting  redness, warmth, pain, swelling or itching at site where injected  tiredness This list may not describe all possible side effects. Call your doctor for medical advice about side effects. You may report side effects to FDA at 1-800-FDA-1088. Where should I keep my medicine? This vaccine is only given in a clinic, pharmacy, doctor's office, or other health care setting and will not be stored at home. NOTE: This sheet is a summary. It may not cover all possible information. If you have questions about this medicine, talk to your doctor, pharmacist, or health care provider.  2020 Elsevier/Gold Standard (2017-05-03 13:20:30)  

## 2020-07-25 MED ORDER — AMPHETAMINE-DEXTROAMPHET ER 30 MG PO CP24: 30.0000 mg | ORAL_CAPSULE | ORAL | 0 refills | Status: DC

## 2020-07-25 MED ORDER — AMPHETAMINE-DEXTROAMPHETAMINE 10 MG PO TABS: ORAL_TABLET | ORAL | 0 refills | Status: DC

## 2020-08-14 ENCOUNTER — Other Ambulatory Visit: Payer: Self-pay | Admitting: Family Medicine

## 2020-08-14 DIAGNOSIS — F900 Attention-deficit hyperactivity disorder, predominantly inattentive type: Secondary | ICD-10-CM

## 2020-08-15 NOTE — Telephone Encounter (Signed)
This was written on 07/25/2020 #90 but patient had a 30 day supply at that time and didn't need until now. She tried to pick it up and the pharmacy told her they need a new RX.   I called [facility] and they confirmed that for some reason their system closed the prescription and it needs resent. Can you please resend?

## 2020-09-13 DIAGNOSIS — Z1231 Encounter for screening mammogram for malignant neoplasm of breast: Secondary | ICD-10-CM | POA: Diagnosis not present

## 2020-09-13 LAB — HM MAMMOGRAPHY

## 2020-10-06 ENCOUNTER — Other Ambulatory Visit: Payer: Self-pay | Admitting: Physician Assistant

## 2020-10-06 DIAGNOSIS — Z566 Other physical and mental strain related to work: Secondary | ICD-10-CM

## 2020-11-18 ENCOUNTER — Other Ambulatory Visit: Payer: Self-pay | Admitting: Neurology

## 2020-11-18 DIAGNOSIS — F900 Attention-deficit hyperactivity disorder, predominantly inattentive type: Secondary | ICD-10-CM

## 2020-11-18 MED ORDER — AMPHETAMINE-DEXTROAMPHET ER 30 MG PO CP24: ORAL_CAPSULE | ORAL | 0 refills | Status: DC

## 2020-11-18 NOTE — Telephone Encounter (Signed)
Needs 90 day RX of Adderall XR.  Last seen in October Told to follow up in 6 months Last written in November

## 2020-12-12 ENCOUNTER — Encounter: Payer: Self-pay | Admitting: Physician Assistant

## 2021-02-10 ENCOUNTER — Ambulatory Visit: Payer: BC Managed Care – PPO | Admitting: Physician Assistant

## 2021-02-10 ENCOUNTER — Other Ambulatory Visit: Payer: Self-pay | Admitting: Physician Assistant

## 2021-02-10 ENCOUNTER — Other Ambulatory Visit: Payer: Self-pay

## 2021-02-10 ENCOUNTER — Encounter: Payer: Self-pay | Admitting: Physician Assistant

## 2021-02-10 VITALS — BP 131/85 | HR 72 | Temp 98.9°F | Resp 20 | Ht 62.0 in | Wt 141.0 lb

## 2021-02-10 DIAGNOSIS — Z13220 Encounter for screening for lipoid disorders: Secondary | ICD-10-CM | POA: Diagnosis not present

## 2021-02-10 DIAGNOSIS — R197 Diarrhea, unspecified: Secondary | ICD-10-CM | POA: Diagnosis not present

## 2021-02-10 DIAGNOSIS — F900 Attention-deficit hyperactivity disorder, predominantly inattentive type: Secondary | ICD-10-CM

## 2021-02-10 DIAGNOSIS — Z131 Encounter for screening for diabetes mellitus: Secondary | ICD-10-CM

## 2021-02-10 DIAGNOSIS — S6991XA Unspecified injury of right wrist, hand and finger(s), initial encounter: Secondary | ICD-10-CM

## 2021-02-10 DIAGNOSIS — R159 Full incontinence of feces: Secondary | ICD-10-CM

## 2021-02-10 MED ORDER — AMPHETAMINE-DEXTROAMPHETAMINE 10 MG PO TABS: ORAL_TABLET | ORAL | 0 refills | Status: DC

## 2021-02-10 MED ORDER — DICYCLOMINE HCL 10 MG PO CAPS: 10.0000 mg | ORAL_CAPSULE | Freq: Two times a day (BID) | ORAL | 1 refills | Status: DC

## 2021-02-10 MED ORDER — AMPHETAMINE-DEXTROAMPHET ER 30 MG PO CP24: ORAL_CAPSULE | ORAL | 0 refills | Status: DC

## 2021-02-10 NOTE — Progress Notes (Signed)
Medication sent at below:   Meds ordered this encounter  Medications  . amphetamine-dextroamphetamine (ADDERALL XR) 30 MG 24 hr capsule    Sig: TAKE 1 CAPSULE(30 MG) BY MOUTH EVERY MORNING    Dispense:  90 capsule    Refill:  0    Order Specific Question:   Supervising Provider    Answer:   Beatrice Lecher D [[id number]]  . amphetamine-dextroamphetamine (ADDERALL) 10 MG tablet    Sig: TAKE 1 TABLET IN THE AFTERNOON AS NEEDED    Dispense:  90 tablet    Refill:  0    Order Specific Question:   Supervising Provider    Answer:   Bonner Puna   02/10/21   Beatrice Lecher, MD

## 2021-02-10 NOTE — Patient Instructions (Signed)
Fecal Incontinence Fecal incontinence, also called accidental bowel leakage, is not being able to control your bowels. This condition happens because the nerves or muscles around the anus do not work the way they should. This affects their ability to hold stool (feces). What are the causes? This condition may be caused by:  Damage to the muscles at the end of the rectum (sphincter).  Damage to the nerves that control bowel movements.  Diarrhea.  Chronic constipation.  Pelvic floor dysfunction. This means the muscles in the pelvis do not work well.  Loss of bowel storage capacity. This occurs when the rectum can no longer stretch in size in order to store feces.  Inflammatory bowel disease (IBD), such as Crohn's disease.  Irritable bowel syndrome (IBS). What increases the risk? You are more likely to develop this condition if you:  Were born with bowels or a pelvis that did not form correctly.  Have had rectal surgery.  Have had radiation treatment for certain cancers.  Have been pregnant, had a vaginal delivery, or had surgery that damaged the pelvic floor muscles.  Had a complicated childbirth, spinal cord injury, or other trauma that caused nerve damage.  Have a condition that can affect nerve function, such as diabetes, Parkinson's disease, or multiple sclerosis.  Have a condition where the rectum drops down into the anus or vagina (prolapse).  Are 65 years of age or older. What are the signs or symptoms? The main symptom of this condition is not being able to control your bowels. You also might not be able to get to the bathroom before a bowel movement. How is this diagnosed? This condition is diagnosed with a medical history and physical exam. You may also have other tests, including:  Blood tests.  Urine tests.  A rectal exam.  Ultrasound.  MRI.  Colonoscopy. This is an exam that looks at your large intestine (colon).  Anal manometry. This is a test that  measures the strength of the anal sphincter.  Anal electromyogram (EMG). This is a test that uses small electrodes to check for nerve damage. How is this treated? Treatment for this condition depends on the cause and severity. Treatment may also focus on addressing any underlying causes of this condition. Treatment may include:  Medicines. This may include medicines to: ? Prevent diarrhea. ? Help with constipation (bulk-forming laxatives). ? Treat any underlying conditions.  Biofeedback therapy. This can help to retrain muscles that are affected.  Fiber supplements. These can help manage your bowel movements.  Nerve stimulation.  Injectable gel to promote tissue growth and better muscle control.  Surgery. You may need: ? Sphincter repair surgery. ? Diversion surgery. This procedure lets feces pass out of your body through a hole in your abdomen. Follow these instructions at home: Eating and drinking  Follow instructions from your health care provider about any eating or drinking restrictions. ? Work with a dietitian to come up with a healthy diet that will help you avoid the foods that can make your condition worse. ? Keep a diet diary to find out which foods or drinks could be making your condition worse.  Drink enough fluid to keep your urine pale yellow.   Lifestyle  Do not use any products that contain nicotine or tobacco, such as cigarettes and e-cigarettes. If you need help quitting, ask your health care provider. This may help your condition.  If you are overweight, talk with your health care provider about how to safely lose weight. This may   help your condition.  Increase your physical activity as told by your health care provider. This may help your condition. Always talk with your health care provider before starting a new exercise program.  Carry a change of clothes and supplies to clean up quickly if you have an episode of fetal incontinence.  Consider joining a  fecal incontinence support group. You can find a support group online or in your local community. General instructions  Take over-the-counter and prescription medicines only as told by your health care provider. This includes any supplements.  Apply a moisture barrier, such as petroleum jelly, to your rectum. This protects the skin from irritation caused by ongoing leaking or diarrhea.  Tell your health care provider if you are upset or depressed about your condition.  Keep all follow-up visits as told by your health care provider. This is important.   Where to find more information  International Foundation for Functional Gastrointestinal Disorders: iffgd.[place] [facility]: patients.gi.org Contact a health care provider if:  You have a fever.  You have redness, swelling, or pain around your rectum.  Your pain is getting worse or you lose feeling in your rectal area.  You have blood in your stool.  You feel sad or hopeless.  You avoid social or work situations. Get help right away if:  You stop having bowel movements.  You cannot eat or drink without vomiting.  You have rectal bleeding that does not stop.  You have severe pain that is getting worse.  You have symptoms of dehydration, including: ? Sleepiness or fatigue. ? Producing little or no urine, tears, or sweat. ? Dizziness. ? Dry mouth. ? Unusual irritability. ? Headache. ? Inability to think clearly. Summary  Fecal incontinence, also called accidental bowel leakage, is not being able to control your bowels. This condition happens because the nerves or muscles around the anus do not work the way they should.  Treatment varies depending on the cause and severity of your condition. Treatment may also focus on addressing any underlying causes of this condition.  Follow instructions from your health care provider about any eating or drinking restrictions, lifestyle changes, and skin  care.  Take over-the-counter and prescription medicines only as told by your health care provider. This includes any supplements.  Tell your health care provider if your symptoms worsen or if you are upset or depressed about your condition. This information is not intended to replace advice given to you by your health care provider. Make sure you discuss any questions you have with your health care provider. Document Revised: 02/03/2018 Document Reviewed: 02/03/2018 Elsevier Patient Education  2021 [employer].

## 2021-02-10 NOTE — Progress Notes (Signed)
Pt requesting 90 day supply.  Seen 02/10/2021.  Needs refills.  Doing well no concerns.  No dose change for years.  Marland KitchenMarland KitchenPDMP reviewed during this encounter.

## 2021-02-10 NOTE — Progress Notes (Signed)
Subjective:    Patient ID: Heather Sullivan, female    DOB: 1961/09/09, 60 y.o.   MRN: [id number]  HPI  Patient is a 60 year old female with ADHD who presents to the clinic for medication refill.  Patient is doing well on Adderall.  She has been controlled for years.  She denies any problems with anxiety, palpitations, headaches, vision changes.  She does admit to pulling a gel nail off about 1 month ago that ripped off her right ring finger nail.  It has been about a month that she sees very little progression of growth.  She wonders if there is anything she should do.  She is also having more problems with diarrhea and fecal incontinence.  When she feels like she has to go some seems to seep out.  She denies any constipation.  She denies any melena or hematochezia.  She denies any abdominal pain.  She has started some fiber Gummies which seem to help a little but then did not help anymore.  She has had this a few years ago and seemed to resolve on its own.  She denies any new stressors or medication changes. s she denies any urinary frequency, urgency, leakage.  .. Active Ambulatory Problems    Diagnosis Date Noted  . Attention deficit disorder 09/14/2007  . WEIGHT GAIN 08/25/2010  . Fecal incontinence 07/25/2015  . Work-related stress 01/01/2016  . Chronic sniffling 02/08/2017  . Adenomatous colon polyp 05/16/2017  . Left breast mass 08/30/2017  . Vaginal atrophy 11/12/2017  . Right ear pain 02/02/2018  . Injury of nail bed of finger of right hand 02/10/2021  . Diarrhea 02/10/2021   Resolved Ambulatory Problems    Diagnosis Date Noted  . Left ear impacted cerumen 02/08/2017   Past Medical History:  Diagnosis Date  . ADD (attention deficit disorder)   . Diverticulosis      Review of Systems See HPI.     Objective:   Physical Exam Vitals reviewed.  Constitutional:      Appearance: Normal appearance.  Cardiovascular:     Rate and Rhythm: Normal rate and regular rhythm.      Pulses: Normal pulses.     Heart sounds: Normal heart sounds.  Pulmonary:     Effort: Pulmonary effort is normal.     Breath sounds: Normal breath sounds.  Abdominal:     General: There is no distension.     Palpations: Abdomen is soft. There is no mass.     Tenderness: There is no abdominal tenderness. There is no guarding or rebound.  Musculoskeletal:     Cervical back: Normal range of motion.  Skin:    Comments: Right ring fingernail bed absent with dead skin on nail bed but new nail growing. No erythema or swelling.   Neurological:     Mental Status: She is alert.  Psychiatric:        Mood and Affect: Mood normal.           Assessment & Plan:  Marland KitchenMarland KitchenJeana was seen today for adhd.  Diagnoses and all orders for this visit:  Attention deficit hyperactivity disorder (ADHD), predominantly inattentive type -     CBC with Differential/Platelet  Screening for diabetes mellitus -     COMPLETE METABOLIC PANEL WITH GFR  Screening for lipid disorders -     Lipid Panel w/reflex Direct LDL  Diarrhea, unspecified type -     dicyclomine (BENTYL) 10 MG capsule; Take 1 capsule (10 mg total) by  mouth 2 (two) times daily with a meal. -     TSH  Injury of nail bed of finger of right hand, initial encounter  Incontinence of feces, unspecified fecal incontinence type -     dicyclomine (BENTYL) 10 MG capsule; Take 1 capsule (10 mg total) by mouth 2 (two) times daily with a meal.   Adderall refills pended to send in for 90 day supply for supervising physician to send in.  Pt doing well with no concerns. Refilled for 6 months.   Nail bed injury- does not look infected. New nail appears to be growing out. Consider biotin for better nail growth. epson water soaks and gentle pummel stone ok to help growth and debride dead skin. Discussed it will take time for the nail to grow out.   Fecal incontinence- continue on fiber. No red flag signs and symptoms. UtD colonoscopy due next year. Trial  of bentyl. As needed up to bid for diarrhea. I suspect pt is more functional and would benefit from pelvic floor PT. Pt declined today.   Fasting labs ordered today.   Follow up 6 months.

## 2021-02-11 ENCOUNTER — Other Ambulatory Visit: Payer: Self-pay | Admitting: Neurology

## 2021-02-11 DIAGNOSIS — N289 Disorder of kidney and ureter, unspecified: Secondary | ICD-10-CM

## 2021-02-11 LAB — CBC WITH DIFFERENTIAL/PLATELET
Absolute Monocytes: 487 cells/uL (ref 200–950)
Basophils Absolute: 62 cells/uL (ref 0–200)
Basophils Relative: 1.1 %
Eosinophils Absolute: 168 cells/uL (ref 15–500)
Eosinophils Relative: 3 %
HCT: 40.8 % (ref 35.0–45.0)
Hemoglobin: 13.7 g/dL (ref 11.7–15.5)
Lymphs Abs: 2083 cells/uL (ref 850–3900)
MCH: 31.9 pg (ref 27.0–33.0)
MCHC: 33.6 g/dL (ref 32.0–36.0)
MCV: 94.9 fL (ref 80.0–100.0)
MPV: 9.7 fL (ref 7.5–12.5)
Monocytes Relative: 8.7 %
Neutro Abs: 2800 cells/uL (ref 1500–7800)
Neutrophils Relative %: 50 %
Platelets: 300 10*3/uL (ref 140–400)
RBC: 4.3 10*6/uL (ref 3.80–5.10)
RDW: 12.3 % (ref 11.0–15.0)
Total Lymphocyte: 37.2 %
WBC: 5.6 10*3/uL (ref 3.8–10.8)

## 2021-02-11 LAB — COMPLETE METABOLIC PANEL WITH GFR
AG Ratio: 2 (calc) (ref 1.0–2.5)
ALT: 12 U/L (ref 6–29)
AST: 14 U/L (ref 10–35)
Albumin: 4.4 g/dL (ref 3.6–5.1)
Alkaline phosphatase (APISO): 63 U/L (ref 37–153)
BUN/Creatinine Ratio: 17 (calc) (ref 6–22)
BUN: 18 mg/dL (ref 7–25)
CO2: 29 mmol/L (ref 20–32)
Calcium: 9.6 mg/dL (ref 8.6–10.4)
Chloride: 108 mmol/L (ref 98–110)
Creat: 1.07 mg/dL — ABNORMAL HIGH (ref 0.50–0.99)
GFR, Est African American: 65 mL/min/{1.73_m2} (ref >=60)
GFR, Est Non African American: 56 mL/min/{1.73_m2} — ABNORMAL LOW (ref >=60)
Globulin: 2.2 g/dL (calc) (ref 1.9–3.7)
Glucose, Bld: 99 mg/dL (ref 65–99)
Potassium: 4.7 mmol/L (ref 3.5–5.3)
Sodium: 141 mmol/L (ref 135–146)
Total Bilirubin: 0.4 mg/dL (ref 0.2–1.2)
Total Protein: 6.6 g/dL (ref 6.1–8.1)

## 2021-02-11 LAB — LIPID PANEL W/REFLEX DIRECT LDL
Cholesterol: 182 mg/dL (ref <200)
HDL: 55 mg/dL (ref >=50)
LDL Cholesterol (Calc): 108 mg/dL (calc) — ABNORMAL HIGH
Non-HDL Cholesterol (Calc): 127 mg/dL (calc) (ref <130)
Total CHOL/HDL Ratio: 3.3 (calc) (ref <5.0)
Triglycerides: 91 mg/dL (ref <150)

## 2021-02-11 LAB — TSH: TSH: 3.75 mIU/L (ref 0.40–4.50)

## 2021-02-11 NOTE — Progress Notes (Signed)
Heather Sullivan,   LDL, bad cholesterol, is not optimal but still really good for age and risk factors.  HDL, good cholesterol, is good.  Glucose and liver look great.  There is a slight decline in kidney function with GFR just below 60 at 56. Are you taking more aleve or ibuprofen than usual. Are you staying hydrated? This could be a fluke or start of some progressing kidney disease. We need to watch this. Recheck in 3 months BmP with GFR. Limit any NSAID use and make sure staying hydrated.  Thyroid in normal range.

## 2021-02-17 ENCOUNTER — Telehealth: Payer: Self-pay | Admitting: Neurology

## 2021-02-17 NOTE — Telephone Encounter (Signed)
Patient left vm asking for RX for Adderall RX. Made her aware this was sent to the pharmacy on 02/10/2021 that she should contact them and speak with a person to get them to fill it. When it was sent in, it was a few days early for them to fill so they probably held it.

## 2021-04-25 ENCOUNTER — Other Ambulatory Visit: Payer: Self-pay | Admitting: Physician Assistant

## 2021-04-25 DIAGNOSIS — R159 Full incontinence of feces: Secondary | ICD-10-CM

## 2021-04-25 DIAGNOSIS — R197 Diarrhea, unspecified: Secondary | ICD-10-CM

## 2021-05-20 ENCOUNTER — Other Ambulatory Visit: Payer: Self-pay | Admitting: Neurology

## 2021-05-20 DIAGNOSIS — F900 Attention-deficit hyperactivity disorder, predominantly inattentive type: Secondary | ICD-10-CM

## 2021-05-20 MED ORDER — AMPHETAMINE-DEXTROAMPHET ER 30 MG PO CP24
ORAL_CAPSULE | ORAL | 0 refills | Status: DC
Start: 2021-05-20 — End: 2021-08-22

## 2021-05-20 NOTE — Telephone Encounter (Signed)
Patient left vm asking for refill of Adderall 30 mg XR. Last written 02/10/2021 #90 no refills. Last appt 02/10/2021. From last note, "Pt doing well with no concerns. Refill for 6 months."

## 2021-06-04 ENCOUNTER — Encounter: Payer: Self-pay | Admitting: Physician Assistant

## 2021-06-10 DIAGNOSIS — N289 Disorder of kidney and ureter, unspecified: Secondary | ICD-10-CM | POA: Diagnosis not present

## 2021-06-11 LAB — BASIC METABOLIC PANEL WITH GFR
BUN: 14 mg/dL (ref 7–25)
CO2: 29 mmol/L (ref 20–32)
Calcium: 9.6 mg/dL (ref 8.6–10.4)
Chloride: 107 mmol/L (ref 98–110)
Creat: 0.94 mg/dL (ref 0.50–1.05)
Glucose, Bld: 95 mg/dL (ref 65–99)
Potassium: 4.2 mmol/L (ref 3.5–5.3)
Sodium: 142 mmol/L (ref 135–146)
eGFR: 69 mL/min/{1.73_m2} (ref >=60)

## 2021-06-11 NOTE — Progress Notes (Signed)
Heather Sullivan,   Serum creatinine has improved. GFR is above 60 now. Doing great.

## 2021-06-25 DIAGNOSIS — C44719 Basal cell carcinoma of skin of left lower limb, including hip: Secondary | ICD-10-CM | POA: Diagnosis not present

## 2021-06-25 DIAGNOSIS — Z1283 Encounter for screening for malignant neoplasm of skin: Secondary | ICD-10-CM | POA: Diagnosis not present

## 2021-06-25 DIAGNOSIS — L821 Other seborrheic keratosis: Secondary | ICD-10-CM | POA: Diagnosis not present

## 2021-06-25 DIAGNOSIS — D485 Neoplasm of uncertain behavior of skin: Secondary | ICD-10-CM | POA: Diagnosis not present

## 2021-06-25 DIAGNOSIS — D225 Melanocytic nevi of trunk: Secondary | ICD-10-CM | POA: Diagnosis not present

## 2021-07-30 DIAGNOSIS — C44712 Basal cell carcinoma of skin of right lower limb, including hip: Secondary | ICD-10-CM | POA: Diagnosis not present

## 2021-07-30 DIAGNOSIS — C44719 Basal cell carcinoma of skin of left lower limb, including hip: Secondary | ICD-10-CM | POA: Diagnosis not present

## 2021-08-22 ENCOUNTER — Other Ambulatory Visit: Payer: Self-pay | Admitting: Physician Assistant

## 2021-08-22 ENCOUNTER — Ambulatory Visit: Payer: BC Managed Care – PPO | Admitting: Physician Assistant

## 2021-08-22 ENCOUNTER — Other Ambulatory Visit: Payer: Self-pay

## 2021-08-22 ENCOUNTER — Encounter: Payer: Self-pay | Admitting: Physician Assistant

## 2021-08-22 VITALS — BP 139/90 | HR 70 | Temp 98.7°F | Wt 145.1 lb

## 2021-08-22 DIAGNOSIS — F900 Attention-deficit hyperactivity disorder, predominantly inattentive type: Secondary | ICD-10-CM

## 2021-08-22 DIAGNOSIS — R030 Elevated blood-pressure reading, without diagnosis of hypertension: Secondary | ICD-10-CM | POA: Diagnosis not present

## 2021-08-22 DIAGNOSIS — R159 Full incontinence of feces: Secondary | ICD-10-CM | POA: Diagnosis not present

## 2021-08-22 MED ORDER — AMPHETAMINE-DEXTROAMPHET ER 30 MG PO CP24: ORAL_CAPSULE | ORAL | 0 refills | Status: DC

## 2021-08-22 MED ORDER — DICYCLOMINE HCL 10 MG PO CAPS: ORAL_CAPSULE | ORAL | 1 refills | Status: DC

## 2021-08-22 NOTE — Patient Instructions (Signed)

## 2021-08-22 NOTE — Telephone Encounter (Signed)
Saw today in office.  No concerns.  Requests 90 day rx.  Marland Kitchen.PDMP reviewed during this encounter. Rx pended

## 2021-08-22 NOTE — Progress Notes (Signed)
   Subjective:    Patient ID: Heather Sullivan, female    DOB: 1961/09/26, 60 y.o.   MRN: [id number]  HPI Patient is a 60 year old female with ADHD who presents to the clinic for follow-up and refills.  Patient is doing well on Adderall.  She has no concerns or complaints.  She denies any insomnia, anxiety, palpitations, headaches.  She is doing well at work.  Her fecal incontinence is well controlled.  She does use Bentyl as needed.  She does need a refill.  .. Active Ambulatory Problems    Diagnosis Date Noted   Attention deficit disorder 09/14/2007   WEIGHT GAIN 08/25/2010   Fecal incontinence 07/25/2015   Work-related stress 01/01/2016   Chronic sniffling 02/08/2017   Adenomatous colon polyp 05/16/2017   Left breast mass 08/30/2017   Vaginal atrophy 11/12/2017   Right ear pain 02/02/2018   Injury of nail bed of finger of right hand 02/10/2021   Diarrhea 02/10/2021   Elevated blood pressure reading 08/22/2021   Resolved Ambulatory Problems    Diagnosis Date Noted   Left ear impacted cerumen 02/08/2017   Past Medical History:  Diagnosis Date   ADD (attention deficit disorder)    Diverticulosis      Review of Systems  All other systems reviewed and are negative.     Objective:   Physical Exam Constitutional:      Appearance: Normal appearance.  HENT:     Head: Normocephalic.  Cardiovascular:     Rate and Rhythm: Normal rate and regular rhythm.     Pulses: Normal pulses.     Heart sounds: Normal heart sounds.  Pulmonary:     Effort: Pulmonary effort is normal.     Breath sounds: Normal breath sounds.  Musculoskeletal:     Right lower leg: No edema.     Left lower leg: No edema.  Neurological:     General: No focal deficit present.     Mental Status: She is alert and oriented to person, place, and time.  Psychiatric:        Mood and Affect: Mood normal.          Assessment & Plan:  Marland KitchenMarland KitchenAnastasya was seen today for adhd.  Diagnoses and all orders for this  visit:  Attention deficit hyperactivity disorder (ADHD), predominantly inattentive type  Incontinence of feces, unspecified fecal incontinence type -     dicyclomine (BENTYL) 10 MG capsule; TAKE 1 CAPSULE(10 MG) BY MOUTH TWICE DAILY WITH A MEAL  Elevated blood pressure reading  Doing great. Refilled adderall for 3 months. Follow up in 6 months.  Continue to use bentyl as needed.

## 2021-09-19 DIAGNOSIS — Z1231 Encounter for screening mammogram for malignant neoplasm of breast: Secondary | ICD-10-CM | POA: Diagnosis not present

## 2021-09-19 LAB — HM MAMMOGRAPHY

## 2021-09-23 ENCOUNTER — Encounter: Payer: Self-pay | Admitting: Physician Assistant

## 2021-10-07 ENCOUNTER — Other Ambulatory Visit: Payer: Self-pay | Admitting: Physician Assistant

## 2021-10-07 DIAGNOSIS — Z566 Other physical and mental strain related to work: Secondary | ICD-10-CM

## 2021-11-21 ENCOUNTER — Other Ambulatory Visit: Payer: Self-pay | Admitting: Neurology

## 2021-11-21 DIAGNOSIS — F900 Attention-deficit hyperactivity disorder, predominantly inattentive type: Secondary | ICD-10-CM

## 2021-11-21 MED ORDER — AMPHETAMINE-DEXTROAMPHET ER 30 MG PO CP24: ORAL_CAPSULE | ORAL | 0 refills | Status: DC

## 2021-11-21 NOTE — Telephone Encounter (Signed)
Please sign. Controlled with no problems. I am doing 6 month follow ups with her.

## 2021-11-21 NOTE — Telephone Encounter (Signed)
Patient called for refill of Adderall - 90 day supply.  Last seen in November and RX written for 90 days at that time. Per note that not due for an appt for 6 months.  Dr. Madilyn Fireman can you sign RX for patient since requesting 90 days?

## 2022-02-11 ENCOUNTER — Other Ambulatory Visit: Payer: Self-pay | Admitting: Neurology

## 2022-02-11 DIAGNOSIS — R159 Full incontinence of feces: Secondary | ICD-10-CM

## 2022-02-11 MED ORDER — DICYCLOMINE HCL 10 MG PO CAPS: ORAL_CAPSULE | ORAL | 0 refills | Status: DC

## 2022-03-13 ENCOUNTER — Other Ambulatory Visit: Payer: Self-pay | Admitting: Physician Assistant

## 2022-03-13 ENCOUNTER — Ambulatory Visit: Payer: BC Managed Care – PPO | Admitting: Physician Assistant

## 2022-03-13 ENCOUNTER — Encounter: Payer: Self-pay | Admitting: Physician Assistant

## 2022-03-13 VITALS — BP 124/87 | HR 55 | Ht 62.0 in | Wt 147.0 lb

## 2022-03-13 DIAGNOSIS — Z1211 Encounter for screening for malignant neoplasm of colon: Secondary | ICD-10-CM

## 2022-03-13 DIAGNOSIS — Z79899 Other long term (current) drug therapy: Secondary | ICD-10-CM

## 2022-03-13 DIAGNOSIS — Z13220 Encounter for screening for lipoid disorders: Secondary | ICD-10-CM

## 2022-03-13 DIAGNOSIS — Z131 Encounter for screening for diabetes mellitus: Secondary | ICD-10-CM

## 2022-03-13 DIAGNOSIS — F900 Attention-deficit hyperactivity disorder, predominantly inattentive type: Secondary | ICD-10-CM

## 2022-03-13 DIAGNOSIS — Z1329 Encounter for screening for other suspected endocrine disorder: Secondary | ICD-10-CM

## 2022-03-13 DIAGNOSIS — R030 Elevated blood-pressure reading, without diagnosis of hypertension: Secondary | ICD-10-CM

## 2022-03-13 MED ORDER — AMPHETAMINE-DEXTROAMPHET ER 30 MG PO CP24: ORAL_CAPSULE | ORAL | 0 refills | Status: DC

## 2022-03-13 NOTE — Progress Notes (Signed)
Meds ordered this encounter  Medications   amphetamine-dextroamphetamine (ADDERALL XR) 30 MG 24 hr capsule    Sig: TAKE 1 CAPSULE(30 MG) BY MOUTH EVERY MORNING    Dispense:  90 capsule    Refill:  0    Order Specific Question:   Supervising Provider    Answer:   Beatrice Lecher D [[id number]]

## 2022-03-13 NOTE — Progress Notes (Signed)
Established Patient Office Visit  Subjective   Patient ID: Heather Sullivan, female    DOB: [birth year]  Age: 61 y.o. MRN: [id number]  Chief Complaint  Patient presents with   ADHD    HPI Pt is a 61 yo female with ADHD who presents to the clinic for medications refills.   She is doing good. She continues to have episodes of loose stools but not daily. No abdominal pain. She is doing great on adderall. She is sleeping well and no increase in anxiety.   She is due for colonoscopy but does not want to go to digestive health again.   .. Active Ambulatory Problems    Diagnosis Date Noted   Attention deficit disorder 09/14/2007   WEIGHT GAIN 08/25/2010   Fecal incontinence 07/25/2015   Work-related stress 01/01/2016   Chronic sniffling 02/08/2017   Adenomatous colon polyp 05/16/2017   Left breast mass 08/30/2017   Vaginal atrophy 11/12/2017   Right ear pain 02/02/2018   Injury of nail bed of finger of right hand 02/10/2021   Diarrhea 02/10/2021   Elevated blood pressure reading 08/22/2021   Resolved Ambulatory Problems    Diagnosis Date Noted   Left ear impacted cerumen 02/08/2017   Past Medical History:  Diagnosis Date   ADD (attention deficit disorder)    Diverticulosis      Review of Systems  All other systems reviewed and are negative.     Objective:     BP 124/87   Pulse (!) 55   Ht '5\' 2"'$  (1.575 m)   Wt 147 lb (66.7 kg)   LMP 04/11/2011   SpO2 95%   BMI 26.89 kg/m  BP Readings from Last 3 Encounters:  03/13/22 124/87  08/22/21 139/90  02/10/21 131/85      Physical Exam Constitutional:      Appearance: Normal appearance.  HENT:     Head: Normocephalic.  Neck:     Vascular: No carotid bruit.  Cardiovascular:     Rate and Rhythm: Normal rate and regular rhythm.     Pulses: Normal pulses.     Heart sounds: Normal heart sounds.  Pulmonary:     Effort: Pulmonary effort is normal.     Breath sounds: Normal breath sounds.  Musculoskeletal:      Right lower leg: No edema.     Left lower leg: No edema.  Lymphadenopathy:     Cervical: No cervical adenopathy.  Neurological:     General: No focal deficit present.     Mental Status: She is alert and oriented to person, place, and time.  Psychiatric:        Mood and Affect: Mood normal.        Assessment & Plan:  Marland KitchenMarland KitchenMaziyah was seen today for adhd.  Diagnoses and all orders for this visit:  Attention deficit hyperactivity disorder (ADHD), predominantly inattentive type  Elevated blood pressure reading -     COMPLETE METABOLIC PANEL WITH GFR  Screening for lipid disorders -     Lipid Panel w/reflex Direct LDL  Screening for diabetes mellitus -     COMPLETE METABOLIC PANEL WITH GFR  Thyroid disorder screen -     TSH  Medication management -     TSH -     Lipid Panel w/reflex Direct LDL -     COMPLETE METABOLIC PANEL WITH GFR -     CBC with Differential/Platelet  Colon cancer screening -     Ambulatory referral to Gastroenterology  Fasting labs ordered today.  Referral placed for colonoscopy BP looks great Adderall refilled for 90 days Ok for 6 month follow up   Return in about 6 months (around 09/12/2022).    Iran Planas, PA-C

## 2022-03-13 NOTE — Progress Notes (Signed)
Seen today for follow up.  No concerns Doing well.  Requested 90 day.

## 2022-03-14 LAB — CBC WITH DIFFERENTIAL/PLATELET
Absolute Monocytes: 536 cells/uL (ref 200–950)
Basophils Absolute: 80 cells/uL (ref 0–200)
Basophils Relative: 1.2 %
Eosinophils Absolute: 127 cells/uL (ref 15–500)
Eosinophils Relative: 1.9 %
HCT: 39.6 % (ref 35.0–45.0)
Hemoglobin: 13.6 g/dL (ref 11.7–15.5)
Lymphs Abs: 2459 cells/uL (ref 850–3900)
MCH: 32.7 pg (ref 27.0–33.0)
MCHC: 34.3 g/dL (ref 32.0–36.0)
MCV: 95.2 fL (ref 80.0–100.0)
MPV: 9.8 fL (ref 7.5–12.5)
Monocytes Relative: 8 %
Neutro Abs: 3497 cells/uL (ref 1500–7800)
Neutrophils Relative %: 52.2 %
Platelets: 307 10*3/uL (ref 140–400)
RBC: 4.16 10*6/uL (ref 3.80–5.10)
RDW: 11.8 % (ref 11.0–15.0)
Total Lymphocyte: 36.7 %
WBC: 6.7 10*3/uL (ref 3.8–10.8)

## 2022-03-14 LAB — LIPID PANEL W/REFLEX DIRECT LDL
Cholesterol: 188 mg/dL (ref <200)
HDL: 58 mg/dL (ref >=50)
LDL Cholesterol (Calc): 111 mg/dL (calc) — ABNORMAL HIGH
Non-HDL Cholesterol (Calc): 130 mg/dL (calc) — ABNORMAL HIGH (ref <130)
Total CHOL/HDL Ratio: 3.2 (calc) (ref <5.0)
Triglycerides: 91 mg/dL (ref <150)

## 2022-03-14 LAB — COMPLETE METABOLIC PANEL WITH GFR
AG Ratio: 2 (calc) (ref 1.0–2.5)
ALT: 12 U/L (ref 6–29)
AST: 13 U/L (ref 10–35)
Albumin: 4.4 g/dL (ref 3.6–5.1)
Alkaline phosphatase (APISO): 59 U/L (ref 37–153)
BUN: 20 mg/dL (ref 7–25)
CO2: 28 mmol/L (ref 20–32)
Calcium: 9.9 mg/dL (ref 8.6–10.4)
Chloride: 107 mmol/L (ref 98–110)
Creat: 0.82 mg/dL (ref 0.50–1.05)
Globulin: 2.2 g/dL (calc) (ref 1.9–3.7)
Glucose, Bld: 92 mg/dL (ref 65–99)
Potassium: 5 mmol/L (ref 3.5–5.3)
Sodium: 141 mmol/L (ref 135–146)
Total Bilirubin: 0.4 mg/dL (ref 0.2–1.2)
Total Protein: 6.6 g/dL (ref 6.1–8.1)
eGFR: 81 mL/min/{1.73_m2} (ref >=60)

## 2022-03-14 LAB — TSH: TSH: 3.53 mIU/L (ref 0.40–4.50)

## 2022-03-16 NOTE — Progress Notes (Signed)
Cholesterol looks good and stable from 1 year ago.  Thyroid stable.  Kidney and liver look great.  WBC and hemoglobin look wonderful.

## 2022-05-05 ENCOUNTER — Other Ambulatory Visit: Payer: Self-pay | Admitting: Neurology

## 2022-05-05 DIAGNOSIS — F900 Attention-deficit hyperactivity disorder, predominantly inattentive type: Secondary | ICD-10-CM

## 2022-05-05 MED ORDER — AMPHETAMINE-DEXTROAMPHET ER 30 MG PO CP24: ORAL_CAPSULE | ORAL | 0 refills | Status: DC

## 2022-05-05 NOTE — Telephone Encounter (Signed)
LVM requesting 90 day RX for Adderall. This was sent on 03/13/2022 for 90 days, but pharmacy only had 40 pills and did a partial fill. Need updated RX. Pended for 90 days. Copying to Dr. Darene Lamer since may need to have MD send?

## 2022-05-05 NOTE — Telephone Encounter (Signed)
NPs unable to send 90 day supply of Schedule II. Please send?

## 2022-05-25 DIAGNOSIS — H524 Presbyopia: Secondary | ICD-10-CM | POA: Diagnosis not present

## 2022-05-25 DIAGNOSIS — H52203 Unspecified astigmatism, bilateral: Secondary | ICD-10-CM | POA: Diagnosis not present

## 2022-05-25 DIAGNOSIS — H5213 Myopia, bilateral: Secondary | ICD-10-CM | POA: Diagnosis not present

## 2022-05-25 DIAGNOSIS — H43391 Other vitreous opacities, right eye: Secondary | ICD-10-CM | POA: Diagnosis not present

## 2022-06-04 ENCOUNTER — Encounter: Payer: Self-pay | Admitting: Internal Medicine

## 2022-07-05 DIAGNOSIS — D126 Benign neoplasm of colon, unspecified: Secondary | ICD-10-CM | POA: Insufficient documentation

## 2022-07-23 DIAGNOSIS — D122 Benign neoplasm of ascending colon: Secondary | ICD-10-CM | POA: Diagnosis not present

## 2022-07-23 DIAGNOSIS — K5730 Diverticulosis of large intestine without perforation or abscess without bleeding: Secondary | ICD-10-CM | POA: Diagnosis not present

## 2022-07-23 DIAGNOSIS — K635 Polyp of colon: Secondary | ICD-10-CM | POA: Diagnosis not present

## 2022-07-23 DIAGNOSIS — Z86010 Personal history of colonic polyps: Secondary | ICD-10-CM | POA: Diagnosis not present

## 2022-07-23 DIAGNOSIS — Z09 Encounter for follow-up examination after completed treatment for conditions other than malignant neoplasm: Secondary | ICD-10-CM | POA: Diagnosis not present

## 2022-07-23 LAB — HM COLONOSCOPY

## 2022-07-29 ENCOUNTER — Encounter: Payer: Self-pay | Admitting: Physician Assistant

## 2022-07-29 DIAGNOSIS — K635 Polyp of colon: Secondary | ICD-10-CM | POA: Insufficient documentation

## 2022-08-03 ENCOUNTER — Other Ambulatory Visit: Payer: Self-pay | Admitting: Neurology

## 2022-08-03 DIAGNOSIS — F900 Attention-deficit hyperactivity disorder, predominantly inattentive type: Secondary | ICD-10-CM

## 2022-08-03 NOTE — Telephone Encounter (Signed)
Patient called and left vm wanting a refill on Adderall. She gets 90 day supply. Last written 05/05/2022 #90 with no refills. Last appt 03/13/2022. Told to follow up in 6 months.

## 2022-08-05 MED ORDER — AMPHETAMINE-DEXTROAMPHET ER 30 MG PO CP24: ORAL_CAPSULE | ORAL | 0 refills | Status: DC

## 2022-08-05 NOTE — Telephone Encounter (Signed)
Doing well.  No concerns.  Ok for 6 month follow up

## 2022-10-07 DIAGNOSIS — Z1231 Encounter for screening mammogram for malignant neoplasm of breast: Secondary | ICD-10-CM | POA: Diagnosis not present

## 2022-10-07 LAB — HM MAMMOGRAPHY

## 2022-10-26 ENCOUNTER — Encounter: Payer: Self-pay | Admitting: Physician Assistant

## 2022-11-03 ENCOUNTER — Ambulatory Visit: Payer: BC Managed Care – PPO | Admitting: Physician Assistant

## 2022-11-03 ENCOUNTER — Encounter: Payer: Self-pay | Admitting: Physician Assistant

## 2022-11-03 DIAGNOSIS — F900 Attention-deficit hyperactivity disorder, predominantly inattentive type: Secondary | ICD-10-CM

## 2022-11-03 MED ORDER — AMPHETAMINE-DEXTROAMPHET ER 30 MG PO CP24: ORAL_CAPSULE | ORAL | 0 refills | Status: DC

## 2022-11-03 NOTE — Progress Notes (Signed)
Meds ordered this encounter  Medications   amphetamine-dextroamphetamine (ADDERALL XR) 30 MG 24 hr capsule    Sig: TAKE 1 CAPSULE(30 MG) BY MOUTH EVERY MORNING    Dispense:  90 capsule    Refill:  0

## 2022-11-03 NOTE — Progress Notes (Signed)
   Established Patient Office Visit  Subjective   Patient ID: Heather Sullivan, female    DOB: 02-26-1961  Age: 62 y.o. MRN: [id number]  Chief Complaint  Patient presents with   ADHD   Follow-up    HPI Pt is a 62 yo female with ADHD who presents to the clinic for 6 month follow up.   She is doing great on adderall. She has no concerns. She is sleeping well and denies any anxiety.   .. Active Ambulatory Problems    Diagnosis Date Noted   Attention deficit disorder 09/14/2007   WEIGHT GAIN 08/25/2010   Fecal incontinence 07/25/2015   Work-related stress 01/01/2016   Chronic sniffling 02/08/2017   Adenomatous colon polyp 05/16/2017   Left breast mass 08/30/2017   Vaginal atrophy 11/12/2017   Right ear pain 02/02/2018   Injury of nail bed of finger of right hand 02/10/2021   Diarrhea 02/10/2021   Elevated blood pressure reading 08/22/2021   Hyperplastic colon polyp 07/29/2022   Tubular adenoma of colon 07/05/2022   Resolved Ambulatory Problems    Diagnosis Date Noted   Left ear impacted cerumen 02/08/2017   Past Medical History:  Diagnosis Date   ADD (attention deficit disorder)    Diverticulosis     Review of Systems  All other systems reviewed and are negative.     Objective:     BP 127/80   Pulse 65   Ht '5\' 2"'$  (1.575 m)   Wt 146 lb (66.2 kg)   LMP 04/11/2011   SpO2 100%   BMI 26.70 kg/m  BP Readings from Last 3 Encounters:  11/03/22 127/80  03/13/22 124/87  08/22/21 139/90   Wt Readings from Last 3 Encounters:  11/03/22 146 lb (66.2 kg)  03/13/22 147 lb (66.7 kg)  08/22/21 145 lb 1.9 oz (65.8 kg)      Physical Exam Constitutional:      Appearance: Normal appearance.  Cardiovascular:     Rate and Rhythm: Normal rate and regular rhythm.  Pulmonary:     Effort: Pulmonary effort is normal.     Breath sounds: Normal breath sounds.  Musculoskeletal:     Cervical back: Normal range of motion and neck supple. No tenderness.  Lymphadenopathy:      Cervical: No cervical adenopathy.  Neurological:     General: No focal deficit present.     Mental Status: She is alert.  Psychiatric:        Mood and Affect: Mood normal.       The 10-year ASCVD risk score (Arnett DK, et al., 2019) is: 3.7%    Assessment & Plan:  Marland KitchenMarland KitchenMeghen was seen today for adhd and follow-up.  Diagnoses and all orders for this visit:  Attention deficit hyperactivity disorder (ADHD), predominantly inattentive type  Pt is doing great BP looks great Labs UTD Colonoscopy done but not in EMR, will submit request for records via [facility]    Return in about 6 months (around 05/04/2023).    Iran Planas, PA-C

## 2022-11-05 ENCOUNTER — Encounter: Payer: Self-pay | Admitting: Physician Assistant

## 2023-02-10 ENCOUNTER — Telehealth: Payer: Self-pay | Admitting: Physician Assistant

## 2023-02-10 DIAGNOSIS — F900 Attention-deficit hyperactivity disorder, predominantly inattentive type: Secondary | ICD-10-CM

## 2023-02-10 MED ORDER — AMPHETAMINE-DEXTROAMPHET ER 30 MG PO CP24: ORAL_CAPSULE | ORAL | 0 refills | Status: DC

## 2023-02-10 NOTE — Telephone Encounter (Signed)
..  PDMP reviewed during this encounter. No concerns On 6 month follow up protocol.  Sent adderall.

## 2023-02-10 NOTE — Telephone Encounter (Signed)
Patient called requesting a refill of amphetamine-dextroamphetamine (ADDERALL XR) 30 MG 24 hr capsule [[id number]]   Pharmacy - [id number] - [place], [place] - [address] AT [facility] [address]  [address], [place] [id number]

## 2023-05-26 ENCOUNTER — Encounter: Payer: Self-pay | Admitting: Physician Assistant

## 2023-05-26 ENCOUNTER — Other Ambulatory Visit (HOSPITAL_COMMUNITY)
Admission: RE | Admit: 2023-05-26 | Discharge: 2023-05-26 | Disposition: A | Discharge status: Home / Self Care | Payer: BC Managed Care – PPO | Source: Ambulatory Visit | Attending: Physician Assistant | Admitting: Physician Assistant

## 2023-05-26 ENCOUNTER — Ambulatory Visit: Payer: BC Managed Care – PPO | Admitting: Physician Assistant

## 2023-05-26 VITALS — BP 123/70 | HR 60 | Ht 62.0 in | Wt 145.0 lb

## 2023-05-26 DIAGNOSIS — Z780 Asymptomatic menopausal state: Secondary | ICD-10-CM

## 2023-05-26 DIAGNOSIS — Z01419 Encounter for gynecological examination (general) (routine) without abnormal findings: Secondary | ICD-10-CM | POA: Insufficient documentation

## 2023-05-26 DIAGNOSIS — Z13220 Encounter for screening for lipoid disorders: Secondary | ICD-10-CM

## 2023-05-26 DIAGNOSIS — F900 Attention-deficit hyperactivity disorder, predominantly inattentive type: Secondary | ICD-10-CM | POA: Diagnosis not present

## 2023-05-26 DIAGNOSIS — E039 Hypothyroidism, unspecified: Secondary | ICD-10-CM | POA: Diagnosis not present

## 2023-05-26 DIAGNOSIS — Z131 Encounter for screening for diabetes mellitus: Secondary | ICD-10-CM | POA: Diagnosis not present

## 2023-05-26 DIAGNOSIS — Z0000 Encounter for general adult medical examination without abnormal findings: Secondary | ICD-10-CM

## 2023-05-26 MED ORDER — AMPHETAMINE-DEXTROAMPHET ER 30 MG PO CP24
ORAL_CAPSULE | ORAL | 0 refills | Status: DC
Start: 2023-05-26 — End: 2023-08-30

## 2023-05-26 NOTE — Progress Notes (Signed)
Complete physical exam  Patient: Heather Sullivan   DOB: [birth year]   62 y.o. Female  MRN: [id number]  Subjective:    Chief Complaint  Patient presents with   Medical Management of Chronic Issues    Med refill and pap smear    Heather Sullivan is a 62 y.o. female who presents today for a complete physical exam. She reports consuming a general diet.  Pt is active and does walk regularly.  She generally feels well. She reports sleeping well. She does not have additional problems to discuss today.   No concerns with focus. Doing well on adderall and same dose for years. Needs refills.    Most recent fall risk assessment:    11/03/2022    8:47 AM  Fall Risk   Falls in the past year? 0  Number falls in past yr: 0  Injury with Fall? 0  Risk for fall due to : No Fall Risks  Follow up Falls evaluation completed     Most recent depression screenings:    05/26/2023    8:25 AM 11/03/2022    8:47 AM  PHQ 2/9 Scores  PHQ - 2 Score 0 0  PHQ- 9 Score 2     Vision:Within last year and Dental: No current dental problems and Receives regular dental care  Patient Active Problem List   Diagnosis Date Noted   Hyperplastic colon polyp 07/29/2022   Tubular adenoma of colon 07/05/2022   Elevated blood pressure reading 08/22/2021   Injury of nail bed of finger of right hand 02/10/2021   Diarrhea 02/10/2021   Right ear pain 02/02/2018   Vaginal atrophy 11/12/2017   Left breast mass 08/30/2017   Adenomatous colon polyp 05/16/2017   Chronic sniffling 02/08/2017   Work-related stress 01/01/2016   Fecal incontinence 07/25/2015   WEIGHT GAIN 08/25/2010   Attention deficit disorder 09/14/2007   Past Medical History:  Diagnosis Date   ADD (attention deficit disorder)    Diverticulosis    Past Surgical History:  Procedure Laterality Date   CESAREAN SECTION  1991, 1997   COLONOSCOPY     FEMORAL HERNIA REPAIR  1981   POLYPECTOMY     TONSILLECTOMY  1967   Family History  Problem  Relation Age of Onset   Cancer Mother        postmenopausal CA/breast   Colon cancer Neg Hx    Colon polyps Neg Hx    Esophageal cancer Neg Hx    Rectal cancer Neg Hx    Stomach cancer Neg Hx    Allergies  Allergen Reactions   Vitamin E Swelling    Liquid vitamin E made lips swell       Patient Care Team: Nolene Ebbs as PCP - General (Family Medicine)   Outpatient Medications Prior to Visit  Medication Sig   Cholecalciferol (VITAMIN D-1000 MAX ST) 25 MCG (1000 UT) tablet Take 1,000 Units by mouth daily.   [DISCONTINUED] amphetamine-dextroamphetamine (ADDERALL XR) 30 MG 24 hr capsule TAKE 1 CAPSULE(30 MG) BY MOUTH EVERY MORNING   No facility-administered medications prior to visit.    Review of Systems  All other systems reviewed and are negative.         Objective:     BP 123/70   Pulse 60   Ht 5\' 2"  (1.575 m)   Wt 145 lb (65.8 kg)   LMP 04/11/2011   SpO2 97%   BMI 26.52 kg/m  BP Readings from Last 3 Encounters:  05/26/23 123/70  11/03/22 127/80  03/13/22 124/87   Wt Readings from Last 3 Encounters:  05/26/23 145 lb (65.8 kg)  11/03/22 146 lb (66.2 kg)  03/13/22 147 lb (66.7 kg)      Physical Exam  BP 123/70   Pulse 60   Ht 5\' 2"  (1.575 m)   Wt 145 lb (65.8 kg)   LMP 04/11/2011   SpO2 97%   BMI 26.52 kg/m   General Appearance:    Alert, cooperative, no distress, appears stated age  Head:    Normocephalic, without obvious abnormality, atraumatic  Eyes:    PERRL, conjunctiva/corneas clear, EOM's intact, fundi    benign, both eyes  Ears:    Normal TM's and external ear canals, both ears  Nose:   Nares normal, septum midline, mucosa normal, no drainage    or sinus tenderness  Throat:   Lips, mucosa, and tongue normal; teeth and gums normal  Neck:   Supple, symmetrical, trachea midline, no adenopathy;    thyroid:  no enlargement/tenderness/nodules; no carotid   bruit or JVD  Back:     Symmetric, no curvature, ROM normal, no CVA  tenderness  Lungs:     Clear to auscultation bilaterally, respirations unlabored  Chest Wall:    No tenderness or deformity   Heart:    Regular rate and rhythm, S1 and S2 normal, no murmur, rub   or gallop     Abdomen:     Soft, non-tender, bowel sounds active all four quadrants,    no masses, no organomegaly  Genitalia:    Normal female without lesion, discharge or tenderness  Rectal:    Normal tone.   Extremities:   Extremities normal, atraumatic, no cyanosis or edema  Pulses:   2+ and symmetric all extremities  Skin:   Skin color, texture, turgor normal, no rashes or lesions  Lymph nodes:   Cervical, supraclavicular, and axillary nodes normal  Neurologic:   CNII-XII intact, normal strength, sensation and reflexes    throughout      Assessment & Plan:    Routine Health Maintenance and Physical Exam  Immunization History  Administered Date(s) Administered   Influenza Whole 09/14/2007   Influenza,inj,Quad PF,6+ Mos 07/31/2016, 06/08/2018, 07/04/2019, 07/24/2020   Influenza-Unspecified 07/05/2015, 07/05/2017, 06/29/2022   PFIZER(Purple Top)SARS-COV-2 Vaccination 12/14/2019, 01/10/2020, 10/11/2020   Td 07/11/2009   Tdap 01/30/2020   Zoster Recombinant(Shingrix) 07/24/2020, 11/29/2020    Health Maintenance  Topic Date Due   PAP SMEAR-Modifier  11/12/2022   COVID-19 Vaccine (4 - 2023-24 season) 10/05/2023 (Originally 06/05/2022)   INFLUENZA VACCINE  01/03/2024 (Originally 05/06/2023)   MAMMOGRAM  10/08/2023   Colonoscopy  07/23/2029   DTaP/Tdap/Td (3 - Td or Tdap) 01/29/2030   Hepatitis C Screening  Completed   HIV Screening  Completed   Zoster Vaccines- Shingrix  Completed   HPV VACCINES  Aged Out    Discussed health benefits of physical activity, and encouraged her to engage in regular exercise appropriate for her age and condition.  Heather Sullivan was seen today for medical management of chronic issues.  Diagnoses and all orders for this visit:  Routine physical examination -      VITAMIN D 25 Hydroxy (Vit-D Deficiency, Fractures) -     Lipid panel -     CMP14+EGFR -     TSH  Attention deficit hyperactivity disorder (ADHD), predominantly inattentive type -     amphetamine-dextroamphetamine (ADDERALL XR) 30 MG 24 hr capsule; TAKE 1 CAPSULE(30 MG) BY MOUTH EVERY MORNING  Pap smear, as part of routine gynecological examination -     Cytology - PAP  Post-menopausal -     VITAMIN D 25 Hydroxy (Vit-D Deficiency, Fractures)  Screening for lipid disorders -     Lipid panel  Screening for diabetes mellitus -     CMP14+EGFR   .Heather Kitchen Discussed 150 minutes of exercise a week.  Encouraged vitamin D 1000 units and Calcium 1300mg  or 4 servings of dairy a day.  Fasting labs ordered today PHQ-no concerns Vitals look great Pap done today, declined STD testing 2019 pap normal Mammogram UTD Colonoscopy UTD Declined flu and covid vaccine  Adderall refilled for 4 months. Follow up in 6 months.     Return in about 6 months (around 11/26/2023).     Tandy Gaw, PA-C

## 2023-05-26 NOTE — Patient Instructions (Signed)

## 2023-05-26 NOTE — Assessment & Plan Note (Signed)
Well controlled on Adderall. Follow up 6 months.

## 2023-05-27 LAB — CMP14+EGFR
ALT: 13 IU/L (ref 0–32)
AST: 16 IU/L (ref 0–40)
Albumin: 4.4 g/dL (ref 3.9–4.9)
Alkaline Phosphatase: 71 IU/L (ref 44–121)
BUN/Creatinine Ratio: 22 (ref 12–28)
BUN: 21 mg/dL (ref 8–27)
Bilirubin Total: 0.3 mg/dL (ref 0.0–1.2)
CO2: 23 mmol/L (ref 20–29)
Calcium: 10 mg/dL (ref 8.7–10.3)
Chloride: 106 mmol/L (ref 96–106)
Creatinine, Ser: 0.94 mg/dL (ref 0.57–1.00)
Globulin, Total: 2.4 g/dL (ref 1.5–4.5)
Glucose: 91 mg/dL (ref 70–99)
Potassium: 4.7 mmol/L (ref 3.5–5.2)
Sodium: 141 mmol/L (ref 134–144)
Total Protein: 6.8 g/dL (ref 6.0–8.5)
eGFR: 68 mL/min/{1.73_m2} (ref >59)

## 2023-05-27 LAB — LIPID PANEL
Chol/HDL Ratio: 3.3 ratio (ref 0.0–4.4)
Cholesterol, Total: 200 mg/dL — ABNORMAL HIGH (ref 100–199)
HDL: 60 mg/dL (ref >39)
LDL Chol Calc (NIH): 123 mg/dL — ABNORMAL HIGH (ref 0–99)
Triglycerides: 93 mg/dL (ref 0–149)
VLDL Cholesterol Cal: 17 mg/dL (ref 5–40)

## 2023-05-27 LAB — VITAMIN D 25 HYDROXY (VIT D DEFICIENCY, FRACTURES): Vit D, 25-Hydroxy: 55.5 ng/mL (ref 30.0–100.0)

## 2023-05-27 LAB — TSH: TSH: 4.97 u[IU]/mL — ABNORMAL HIGH (ref 0.450–4.500)

## 2023-05-27 NOTE — Progress Notes (Signed)
Heather Sullivan,   Vitamin D looks great.  Kidney, liver, glucose look great.  HDL, good cholesterol, looks great.  TG look good.  LDL looks good.  10 year risk is under 7.5 percent. Keep exercising and keeping low fat diet.   Marland Kitchen.The 10-year ASCVD risk score (Arnett DK, et al., 2019) is: 4%   Values used to calculate the score:     Age: 62 years     Sex: Female     Is Non-Hispanic African American: No     Diabetic: No     Tobacco smoker: No     Systolic Blood Pressure: 123 mmHg     Is BP treated: No     HDL Cholesterol: 60 mg/dL     Total Cholesterol: 200 mg/dL  Your TSH is a little elevated showing signs of transitioning into hypothyroidism. Lets add Free T3 and T4 with anti-TPO to better evaluate.

## 2023-06-02 LAB — CYTOLOGY - PAP
Comment: NEGATIVE
Diagnosis: NEGATIVE
High risk HPV: NEGATIVE

## 2023-06-02 NOTE — Progress Notes (Signed)
Negative for HPV and no abnormal cells.  No more pap smears unless you are having problems.

## 2023-06-04 NOTE — Progress Notes (Signed)
Free T4 in normal range.

## 2023-06-18 LAB — ANTI-TPO AB (RDL): Anti-TPO Ab (RDL): <9 [IU]/mL (ref <9.0)

## 2023-06-18 LAB — T4, FREE: Free T4: 1.05 ng/dL (ref 0.82–1.77)

## 2023-06-18 LAB — SPECIMEN STATUS REPORT

## 2023-06-18 LAB — T3, FREE: T3, Free: 2.8 pg/mL (ref 2.0–4.4)

## 2023-08-30 ENCOUNTER — Other Ambulatory Visit: Payer: Self-pay | Admitting: Physician Assistant

## 2023-08-30 DIAGNOSIS — F900 Attention-deficit hyperactivity disorder, predominantly inattentive type: Secondary | ICD-10-CM

## 2023-08-30 MED ORDER — AMPHETAMINE-DEXTROAMPHET ER 30 MG PO CP24: ORAL_CAPSULE | ORAL | 0 refills | Status: DC

## 2023-08-30 MED ORDER — AMPHETAMINE-DEXTROAMPHET ER 30 MG PO CP24
ORAL_CAPSULE | ORAL | 0 refills | Status: DC
Start: 2023-08-30 — End: 2023-12-03

## 2023-08-30 MED ORDER — AMPHETAMINE-DEXTROAMPHET ER 30 MG PO CP24
ORAL_CAPSULE | ORAL | 0 refills | Status: DC
Start: 2023-10-29 — End: 2023-12-03

## 2023-08-30 NOTE — Telephone Encounter (Signed)
No concerns Sent 3 months.  Next OV in 3 months

## 2023-08-30 NOTE — Telephone Encounter (Signed)
Copied from CRM [id number]. Topic: Clinical - Medication Refill >> Aug 30, 2023  8:33 AM Gaetano Hawthorne wrote: Most Recent Primary Care Visit:  Provider: Jomarie Longs  Department: [facility]  Visit Type: OFFICE VISIT  Date: 05/26/2023  Medication: Adderral  Has the patient contacted their pharmacy? Yes (Agent: If no, request that the patient contact the pharmacy for the refill. If patient does not wish to contact the pharmacy document the reason why and proceed with request.) (Agent: If yes, when and what did the pharmacy advise?)  Is this the correct pharmacy for this prescription? Yes If no, delete pharmacy and type the correct one.  This is the patient's preferred pharmacy:  [id number] Pura Spice, [place] - [address] AT [facility] [address] [place] [address] Phone: [phone] Fax: [phone]   Has the prescription been filled recently? Yes  Is the patient out of the medication? No  Has the patient been seen for an appointment in the last year OR does the patient have an upcoming appointment? Yes  Can we respond through MyChart? Yes  Agent: Please be advised that Rx refills may take up to 3 business days. We ask that you follow-up with your pharmacy.

## 2023-10-13 DIAGNOSIS — Z1231 Encounter for screening mammogram for malignant neoplasm of breast: Secondary | ICD-10-CM | POA: Diagnosis not present

## 2023-10-13 LAB — HM MAMMOGRAPHY

## 2023-10-15 ENCOUNTER — Encounter: Payer: Self-pay | Admitting: Physician Assistant

## 2023-10-27 DIAGNOSIS — D485 Neoplasm of uncertain behavior of skin: Secondary | ICD-10-CM | POA: Diagnosis not present

## 2023-10-27 DIAGNOSIS — D225 Melanocytic nevi of trunk: Secondary | ICD-10-CM | POA: Diagnosis not present

## 2023-10-27 DIAGNOSIS — Z1283 Encounter for screening for malignant neoplasm of skin: Secondary | ICD-10-CM | POA: Diagnosis not present

## 2023-10-27 DIAGNOSIS — L570 Actinic keratosis: Secondary | ICD-10-CM | POA: Diagnosis not present

## 2023-10-27 DIAGNOSIS — X32XXXA Exposure to sunlight, initial encounter: Secondary | ICD-10-CM | POA: Diagnosis not present

## 2023-12-02 ENCOUNTER — Other Ambulatory Visit: Payer: Self-pay | Admitting: Physician Assistant

## 2023-12-02 DIAGNOSIS — F900 Attention-deficit hyperactivity disorder, predominantly inattentive type: Secondary | ICD-10-CM

## 2023-12-02 NOTE — Telephone Encounter (Signed)
 Copied from CRM [id number]. Topic: Clinical - Medication Refill >> Dec 02, 2023  9:08 AM Nila Nephew wrote: Most Recent Primary Care Visit:  Provider: Jomarie Longs  Department: [facility] CARE [facility]  Visit Type: OFFICE VISIT  Date: 05/26/2023  Medication: amphetamine-dextroamphetamine (ADDERALL XR) 30 MG 24 hr capsule  Has the patient contacted their pharmacy? No  Is this the correct pharmacy for this prescription? Yes  This is the patient's preferred pharmacy:  [facility] #[id number] Pura Spice, [place] - [address] AT [facility] [address] & [address] [place] [address] Phone: [phone] Fax: [phone]   Has the prescription been filled recently? No  Is the patient out of the medication? Yes  Has the patient been seen for an appointment in the last year OR does the patient have an upcoming appointment? Yes  Can we respond through MyChart? No  Agent: Please be advised that Rx refills may take up to 3 business days. We ask that you follow-up with your pharmacy.

## 2023-12-02 NOTE — Telephone Encounter (Signed)
 Last Fill: 09/29/23  Last OV: 05/26/23 Next OV: None Scheduled  Routing to provider for review/authorization.

## 2023-12-03 MED ORDER — AMPHETAMINE-DEXTROAMPHET ER 30 MG PO CP24: ORAL_CAPSULE | ORAL | 0 refills | Status: DC

## 2023-12-06 NOTE — Telephone Encounter (Signed)
 Attempted call to patient. Left a voice mail message requesting a return call.

## 2023-12-10 NOTE — Telephone Encounter (Signed)
 Patient schld for 12/28/23 6 mth f/u appt

## 2023-12-28 ENCOUNTER — Encounter: Payer: Self-pay | Admitting: Physician Assistant

## 2023-12-28 ENCOUNTER — Ambulatory Visit: Admitting: Physician Assistant

## 2023-12-28 VITALS — BP 122/80 | HR 84 | Ht 62.0 in | Wt 165.0 lb

## 2023-12-28 DIAGNOSIS — E6609 Other obesity due to excess calories: Secondary | ICD-10-CM

## 2023-12-28 DIAGNOSIS — E66811 Obesity, class 1: Secondary | ICD-10-CM | POA: Diagnosis not present

## 2023-12-28 DIAGNOSIS — Z79899 Other long term (current) drug therapy: Secondary | ICD-10-CM

## 2023-12-28 DIAGNOSIS — R7989 Other specified abnormal findings of blood chemistry: Secondary | ICD-10-CM | POA: Diagnosis not present

## 2023-12-28 DIAGNOSIS — F900 Attention-deficit hyperactivity disorder, predominantly inattentive type: Secondary | ICD-10-CM

## 2023-12-28 DIAGNOSIS — Z6830 Body mass index (BMI) 30.0-30.9, adult: Secondary | ICD-10-CM

## 2023-12-28 MED ORDER — ZEPBOUND 2.5 MG/0.5ML ~~LOC~~ SOAJ: 2.5000 mg | SUBCUTANEOUS | 0 refills | Status: DC

## 2023-12-28 MED ORDER — AMPHETAMINE-DEXTROAMPHET ER 30 MG PO CP24
ORAL_CAPSULE | ORAL | 0 refills | Status: DC
Start: 2023-12-28 — End: 2024-04-20

## 2023-12-28 MED ORDER — ZEPBOUND 5 MG/0.5ML ~~LOC~~ SOAJ: 5.0000 mg | SUBCUTANEOUS | 1 refills | Status: DC

## 2023-12-28 NOTE — Progress Notes (Signed)
 Meds ordered this encounter  Medications   tirzepatide (ZEPBOUND) 2.5 MG/0.5ML Pen    Sig: Inject 2.5 mg into the skin once a week.    Dispense:  2 mL    Refill:  0    Supervising Provider:   Nani Gasser D [[id number]]   tirzepatide (ZEPBOUND) 5 MG/0.5ML Pen    Sig: Inject 5 mg into the skin once a week.    Dispense:  2 mL    Refill:  1    Supervising Provider:   Nani Gasser D [[id number]]   amphetamine-dextroamphetamine (ADDERALL XR) 30 MG 24 hr capsule    Sig: TAKE 1 CAPSULE(30 MG) BY MOUTH EVERY MORNING    Dispense:  90 capsule    Refill:  0    Supervising Provider:   Nani Gasser D [[id number]]

## 2023-12-28 NOTE — Patient Instructions (Signed)
 Tirzepatide Injection (Weight Management) What is this medication? TIRZEPATIDE (tir ZEP a tide) promotes weight loss. It may also be used to maintain weight loss.  It works by decreasing appetite. Changes to diet and exercise are often combined with this medication. This medicine may be used for other purposes; ask your health care provider or pharmacist if you have questions. COMMON BRAND NAME(S): Zepbound What should I tell my care team before I take this medication? They need to know if you have any of these conditions: Diabetes Eye disease caused by diabetes Gallbladder disease Have or have had depression Have or have had pancreatitis Having surgery Kidney disease Personal or family history of MEN 2, a condition that causes endocrine gland tumors Personal or family history of thyroid cancer Stomach or intestine problems, such as problems digesting food Suicidal thoughts, plans, or attempt An unusual or allergic reaction to tirzepatide, other medications, foods, dyes, or preservatives Pregnant or trying to get pregnant Breastfeeding How should I use this medication? This medication is injected under the skin. You will be taught how to prepare and give it. Take it as directed on the prescription label. Keep taking it unless your care team tells you to stop. It is important that you put your used needles and syringes in a special sharps container. Do not put them in a trash can. If you do not have a sharps container, call your pharmacist or care team to get one. A special MedGuide will be given to you by the pharmacist with each prescription and refill. Be sure to read this information carefully each time. This medication comes with INSTRUCTIONS FOR USE. Ask your pharmacist for directions on how to use this medication. Read the information carefully. Talk to your pharmacist or care team if you have questions. Talk to your care team about the use of this medication in children. Special care  may be needed. Overdosage: If you think you have taken too much of this medicine contact a poison control center or emergency room at once. NOTE: This medicine is only for you. Do not share this medicine with others. What if I miss a dose? If you miss a dose, take it as soon as you can unless it is more than 4 days (96 hours) late. If it is more than 4 days late, skip the missed dose. Take the next dose at the normal time. Do not take 2 doses within 3 days (72 hours) of each other. What may interact with this medication? Certain medications for diabetes, such as insulin, glyburide, glipizide This medication may affect how other medications work. Talk with your care team about all of the medications you take. They may suggest changes to your treatment plan to lower the risk of side effects and to make sure your medications work as intended. This list may not describe all possible interactions. Give your health care provider a list of all the medicines, herbs, non-prescription drugs, or dietary supplements you use. Also tell them if you smoke, drink alcohol, or use illegal drugs. Some items may interact with your medicine. What should I watch for while using this medication? Visit your care team for regular checks on your progress. Tell your care team if your condition does not start to get better or if it gets worse. Tell your care team if you are taking medication to treat diabetes, such as insulin or glipizide. This may increase your risk of low blood sugar. Know the symptoms of low blood sugar and how to  treat it. Talk to your care team about your risk of cancer. You may be more at risk for certain types of cancer if you take this medication. Talk to your care team right away if you have a lump or swelling in your neck, hoarseness that does not go away, trouble swallowing, shortness of breath, or trouble breathing. Make sure you stay hydrated while taking this medication. Drink water often. Eat fruits  and veggies that have a high water content. Drink more water when it is hot or you are active. Talk to your care team right away if you have fever, infection, vomiting, diarrhea, or if you sweat a lot while taking this medication. The loss of too much body fluid may make it dangerous for you to take this medication. If you are going to need surgery or a procedure, tell your care team that you are taking this medication. Estrogen and progestin hormones that you take by mouth may not work as well while you are taking this medication. Switch to a non-oral contraceptive or add a barrier contraceptive for 4 weeks after starting this medication and after each dose increase. Talk to your care team about contraceptive options. They can help you find the option that works for you. What side effects may I notice from receiving this medication? Side effects that you should report to your care team as soon as possible: Allergic reactions or angioedema--skin rash, itching or hives, swelling of the face, eyes, lips, tongue, arms, or legs, trouble swallowing or breathing Bowel blockage--stomach cramping, unable to have a bowel movement or pass gas, loss of appetite, vomiting Change in vision Dehydration--increased thirst, dry mouth, feeling faint or lightheaded, headache, dark yellow or brown urine Gallbladder problems--severe stomach pain, nausea, vomiting, fever Kidney injury--decrease in the amount of urine, swelling of the ankles, hands, or feet Pancreatitis--severe stomach pain that spreads to your back or gets worse after eating or when touched, fever, nausea, vomiting Thoughts of suicide or self-harm, worsening mood, feelings of depression Thyroid cancer--new mass or lump in the neck, pain or trouble swallowing, trouble breathing, hoarseness Side effects that usually do not require medical attention (report these to your care team if they continue or are bothersome): Diarrhea Loss of appetite Nausea Upset  stomach This list may not describe all possible side effects. Call your doctor for medical advice about side effects. You may report side effects to FDA at 1-800-FDA-1088. Where should I keep my medication? Keep out of the reach of children and pets. Store in a refrigerator or at room temperature up to 30 degrees C (86 degrees F). Keep it in the original container. Protect from light. Refrigeration (preferred): Store in the refrigerator. Do not freeze. Get rid of any unused medication after the expiration date. Room temperature: This medication may be stored at room temperature for up to 21 days. If it is stored at room temperature, get rid of any unused medication after 21 days or after it expires, whichever is first. To get rid of medications that are no longer needed or have expired: Take the medication to a medication take-back program. Check with your pharmacy or law enforcement to find a location. If you cannot return the medication, ask your pharmacist or care team how to get rid of this medication safely. NOTE: This sheet is a summary. It may not cover all possible information. If you have questions about this medicine, talk to your doctor, pharmacist, or health care provider.  2024 Elsevier/Gold Standard (2023-09-03 00:00:00)Naltrexone; Bupropion  Extended-Release Tablets What is this medication? NALTREXONE; BUPROPION (nal TREX one; byoo PROE pee on) promotes weight loss. It may also be used to maintain weight loss. It works by decreasing appetite. Changes to diet and exercise are often combined with this medication. This medicine may be used for other purposes; ask your health care provider or pharmacist if you have questions. COMMON BRAND NAME(S): Contrave What should I tell my care team before I take this medication? They need to know if you have any of these conditions: An eating disorder, such as anorexia or bulimia Diabetes Depression Frequently drink alcohol Glaucoma Head  injury Heart disease High blood pressure History of a tumor or infection of your brain or spine History of heart attack or stroke History of irregular heartbeat History of substance use disorder or alcohol use disorder Kidney disease Liver disease Low levels of sodium in the blood Mental health condition Seizures Suicidal thoughts, plans, or attempt by you or a family member Taken an MAOI like Carbex, Eldepryl, Marplan, Nardil, or Parnate in last 14 days An unusual or allergic reaction to bupropion, naltrexone, other medications, foods, dyes, or preservatives Breast-feeding Pregnant or trying to become pregnant How should I use this medication? Take this medication by mouth with a full glass of water. Take it as directed on the prescription label at the same time every day. Do not cut, crush, or chew this medication. Swallow the tablets whole. You can take it with or without food. Do not take with high-fat meals as this may increase your risk of seizures. Keep taking it unless your care team tells you to stop. A special MedGuide will be given to you by the pharmacist with each prescription and refill. Be sure to read this information carefully each time. Talk to your care team about the use of this medication in children. Special care may be needed. Overdosage: If you think you have taken too much of this medicine contact a poison control center or emergency room at once. NOTE: This medicine is only for you. Do not share this medicine with others. What if I miss a dose? If you miss a dose, skip it. Take your next dose at the normal time. Do not take extra or 2 doses at the same time to make up for the missed dose. What may interact with this medication? Do not take this medication with any of the following: Any medications used to stop taking opioids, such as methadone or buprenorphine Linezolid MAOIs, such as Carbex, Eldepryl, Marplan, Nardil, and Parnate Methylene blue (injected into  a vein) Opioid medications Other medications that contain bupropion, such as Zyban or Wellbutrin This medication may also interact with the following: Alcohol Certain medications for blood pressure, such as metoprolol, propranolol Certain medications for depression, anxiety, or mental health conditions Certain medications for HIV or hepatitis Certain medications for irregular heart beat, such as propafenone, flecainide Certain medications for Parkinson disease, such as amantadine, levodopa Certain medications for seizures, such as carbamazepine, phenytoin, phenobarbital Certain medications for sleep Cimetidine Clopidogrel Cyclophosphamide Digoxin Disulfiram Furazolidone Isoniazid Nicotine Orphenadrine Procarbazine Steroid medications, such as prednisone or cortisone Stimulant medications for attention disorders, weight loss, or to stay awake Tamoxifen Theophylline Thiotepa Ticlopidine Tramadol Warfarin This list may not describe all possible interactions. Give your health care provider a list of all the medicines, herbs, non-prescription drugs, or dietary supplements you use. Also tell them if you smoke, drink alcohol, or use illegal drugs. Some items may interact with your medicine. What should  I watch for while using this medication? Visit your care team for regular checks on your progress. This medication may cause serious skin reactions. They can happen weeks to months after starting the medication. Contact your care team right away if you notice fevers or flu-like symptoms with a rash. The rash may be red or purple and then turn into blisters or peeling of the skin. Or, you might notice a red rash with swelling of the face, lips, or lymph nodes in your neck or under your arms. This medication may affect blood sugar. Ask your care team if changes in diet or medications are needed if you have diabetes. Patients and their families should watch out for new or worsening depression  or thoughts of suicide. This includes sudden changes in mood, behaviors, or thoughts. These changes can happen at any time but are more common in the beginning of treatment or after a change in dose. Call your care team right away if you experience these thoughts or worsening depression. Avoid alcoholic drinks while taking this medication. Drinking large amounts of alcoholic beverages, using sleeping or anxiety medications, or quickly stopping the use of these agents while taking this medication may increase your risk for a seizure. Do not drive or use heavy machinery until you know how this medication affects you. This medication can impair your ability to perform these tasks. Inform your care team if you wish to become pregnant or think you might be pregnant. Losing weight while pregnant is not advised and may cause harm to the unborn child. Talk to your care team for more information. What side effects may I notice from receiving this medication? Side effects that you should report to your care team as soon as possible: Allergic reactions--skin rash, itching, hives, swelling of the face, lips, tongue, or throat Fast or irregular heartbeat Increase in blood pressure Liver injury--right upper belly pain, loss of appetite, nausea, light-colored stool, dark yellow or brown urine, yellowing skin or eyes, unusual weakness or fatigue Mood and behavior changes--anxiety, nervousness, confusion, hallucinations, irritability, hostility, thoughts of suicide or self-harm, worsening mood, feelings of depression Redness, blistering, peeling, or loosening of the skin, including inside the mouth Seizures Sudden eye pain or change in vision such as blurry vision, seeing halos around lights, vision loss Side effects that usually do not require medical attention (report to your care team if they continue or are bothersome): Constipation Dizziness Dry mouth Fatigue Headache Nausea Trouble sleeping Vomiting This  list may not describe all possible side effects. Call your doctor for medical advice about side effects. You may report side effects to FDA at 1-800-FDA-1088. Where should I keep my medication? Keep out of the reach of children and pets. Store between 15 and 30 degrees C (59 and 86 degrees F). Get rid of any unused medication after the expiration date. To get rid of medications that are no longer needed or have expired: Take the medication to a medication take-back program. Check with your pharmacy or law enforcement to find a location. If you cannot return the medication, check the label or package insert to see if the medication should be thrown out in the garbage or flushed down the toilet. If you are not sure, ask your care team. If it is safe to put it in the trash, pour the medication out of the container. Mix the medication with cat litter, dirt, coffee grounds, or other unwanted substance. Seal the mixture in a bag or container. Put it in the  trash. NOTE: This sheet is a summary. It may not cover all possible information. If you have questions about this medicine, talk to your doctor, pharmacist, or health care provider.  2024 Elsevier/Gold Standard (2021-11-17 00:00:00)

## 2023-12-28 NOTE — Progress Notes (Signed)
   Established Patient Office Visit  Subjective   Patient ID: Heather Sullivan, female    DOB: April 18, 1961  Age: 63 y.o. MRN: [id number]  Chief Complaint  Patient presents with   Medical Management of Chronic Issues    6 mo fup on adhd med refills    HPI Pt is a 63 yo female who presents to the clinic for medication refills.   She is taking adderall for ADD and doing well. No concerns. Denies any anxiety or problems sleeping.   Last labs TSH was elevated but free levels were normal of thyroid hormone.   She is frustrated with weight. She would like to consider medication help. She walks most days for exercise. She is trying to watch what she eats.    Review of Systems  All other systems reviewed and are negative.     Objective:     BP 122/80   Pulse 84   Ht 5\' 2"  (1.575 m)   Wt 165 lb (74.8 kg)   LMP 04/11/2011   SpO2 94%   BMI 30.18 kg/m  BP Readings from Last 3 Encounters:  12/28/23 122/80  05/26/23 123/70  11/03/22 127/80   Wt Readings from Last 3 Encounters:  12/28/23 165 lb (74.8 kg)  05/26/23 145 lb (65.8 kg)  11/03/22 146 lb (66.2 kg)      Physical Exam Constitutional:      Appearance: Normal appearance.  HENT:     Head: Normocephalic.  Cardiovascular:     Rate and Rhythm: Normal rate and regular rhythm.  Pulmonary:     Effort: Pulmonary effort is normal.  Musculoskeletal:     Right lower leg: No edema.     Left lower leg: No edema.  Neurological:     General: No focal deficit present.     Mental Status: She is alert and oriented to person, place, and time.  Psychiatric:        Mood and Affect: Mood normal.       The 10-year ASCVD risk score (Arnett DK, et al., 2019) is: 3.9%    Assessment & Plan:  Marland KitchenMarland KitchenGuilianna was seen today for medical management of chronic issues.  Diagnoses and all orders for this visit:  Attention deficit hyperactivity disorder (ADHD), predominantly inattentive type  Elevated TSH -     TSH + free T4  Medication  management -     CMP14+EGFR  Class 1 obesity due to excess calories without serious comorbidity with body mass index (BMI) of 30.0 to 30.9 in adult -     tirzepatide (ZEPBOUND) 2.5 MG/0.5ML Pen; Inject 2.5 mg into the skin once a week. -     tirzepatide (ZEPBOUND) 5 MG/0.5ML Pen; Inject 5 mg into the skin once a week.   Adderall refilled for 3 months  Will send to Dr. Judie Petit to send 90 day rx.  Follow up in 6 months  Recheck TSH and free T4 today Cmp ordered  .Marland KitchenDiscussed low carb diet with 1500 calories and 80g of protein.  Exercising at least 150 minutes a week.  [employer]itness [employer] could be a Chief Technology Officer.  Sent zepbound Discussed SE and titration up Showed pen and how to use Follow up in 3 months    Return in about 6 months (around 06/29/2024).    Tandy Gaw, PA-C

## 2023-12-29 ENCOUNTER — Encounter: Payer: Self-pay | Admitting: Physician Assistant

## 2023-12-29 LAB — CMP14+EGFR
ALT: 12 IU/L (ref 0–32)
AST: 17 IU/L (ref 0–40)
Albumin: 4.4 g/dL (ref 3.9–4.9)
Alkaline Phosphatase: 71 IU/L (ref 44–121)
BUN/Creatinine Ratio: 28 (ref 12–28)
BUN: 22 mg/dL (ref 8–27)
Bilirubin Total: <0.2 mg/dL (ref 0.0–1.2)
CO2: 21 mmol/L (ref 20–29)
Calcium: 9.6 mg/dL (ref 8.7–10.3)
Chloride: 106 mmol/L (ref 96–106)
Creatinine, Ser: 0.78 mg/dL (ref 0.57–1.00)
Globulin, Total: 2.3 g/dL (ref 1.5–4.5)
Glucose: 93 mg/dL (ref 70–99)
Potassium: 4.5 mmol/L (ref 3.5–5.2)
Sodium: 143 mmol/L (ref 134–144)
Total Protein: 6.7 g/dL (ref 6.0–8.5)
eGFR: 85 mL/min/{1.73_m2} (ref >59)

## 2023-12-29 LAB — TSH+FREE T4
Free T4: 1.19 ng/dL (ref 0.82–1.77)
TSH: 4.05 u[IU]/mL (ref 0.450–4.500)

## 2023-12-29 NOTE — Progress Notes (Signed)
 TSH in normal range. Kidney, liver, glucose look good.

## 2023-12-31 ENCOUNTER — Other Ambulatory Visit (HOSPITAL_COMMUNITY): Payer: Self-pay

## 2023-12-31 ENCOUNTER — Telehealth: Payer: Self-pay | Admitting: Pharmacy Technician

## 2023-12-31 NOTE — Telephone Encounter (Signed)
 Please review the encounter for PA outcome for Zepbound.

## 2023-12-31 NOTE — Telephone Encounter (Signed)
 Pharmacy Patient Advocate Encounter   Received notification from Onbase that prior authorization for ZEPBOUND 2.5MG /0.5ML AUTO-INJECTORS is required/requested.   Insurance verification completed.   The patient is insured through [facility] .   Per test claim: WEIGHT LOSS DRUGS ARE NOT COVERED

## 2024-01-03 ENCOUNTER — Encounter: Payer: Self-pay | Admitting: Physician Assistant

## 2024-01-03 NOTE — Telephone Encounter (Signed)
 Task completed. Patient notified via MyChart msg.

## 2024-01-24 ENCOUNTER — Other Ambulatory Visit: Payer: Self-pay | Admitting: Physician Assistant

## 2024-01-24 DIAGNOSIS — E66811 Obesity, class 1: Secondary | ICD-10-CM

## 2024-01-25 ENCOUNTER — Telehealth: Payer: Self-pay | Admitting: Physician Assistant

## 2024-01-25 MED ORDER — ZEPBOUND 5 MG/0.5ML ~~LOC~~ SOAJ: 5.0000 mg | SUBCUTANEOUS | 1 refills | Status: DC

## 2024-01-25 MED ORDER — ZEPBOUND 2.5 MG/0.5ML ~~LOC~~ SOAJ
2.5000 mg | SUBCUTANEOUS | 0 refills | Status: DC
Start: 2024-01-25 — End: 2024-05-22

## 2024-01-25 NOTE — Telephone Encounter (Signed)
Resent prescriptions.

## 2024-01-25 NOTE — Telephone Encounter (Signed)
 Copied from CRM [id number]. Topic: Clinical - Medication Refill >> Jan 25, 2024  9:01 AM [name] wrote: Most Recent Primary Care Visit:  Provider: Araceli Knight  Department: [facility] C[facility]  Visit Type: OFFICE VISIT  Date: 12/28/2023  Medication: tirzepatide  (ZEPBOUND ) 2.5 MG/0.5ML Pen tirzepatide  (ZEPBOUND ) 5 MG/0.5ML Pen    Has the patient contacted their pharmacy? Yes (Agent: If no, request that the patient contact the pharmacy for the refill. If patient does not wish to contact the pharmacy document the reason why and proceed with request.) (Agent: If yes, when and what did the pharmacy advise?)  Is this the correct pharmacy for this prescription? Yes If no, delete pharmacy and type the correct one.  This is the patient's preferred pharmacy:  [id number] - [place], [place] - [address] AT [facility] [address] [place] [address] Phone: [phone] Fax: [phone]   Has the prescription been filled recently? No  Is the patient out of the medication? No Needs ICD code to process  Has the patient been seen for an appointment in the last year OR does the patient have an upcoming appointment? Yes  Can we respond through MyChart? Yes  Agent: Please be advised that Rx refills may take up to 3 business days. We ask that you follow-up with your pharmacy.

## 2024-01-25 NOTE — Addendum Note (Signed)
 Addended by: Doretha Ganja on: 01/25/2024 02:32 PM   Modules accepted: Orders

## 2024-03-28 ENCOUNTER — Telehealth: Payer: Self-pay

## 2024-03-28 MED ORDER — ZEPBOUND 7.5 MG/0.5ML ~~LOC~~ SOAJ
7.5000 mg | SUBCUTANEOUS | 0 refills | Status: DC
Start: 2024-03-28 — End: 2024-05-17

## 2024-03-28 NOTE — Telephone Encounter (Signed)
 Requesting rx rf of zepbound  with strength increase as 7.5mg  and 10mg  .  Last written as zepbound  5mg   01/27/2024 Lat OV 12/28/2023 Upcoming appt = none

## 2024-03-28 NOTE — Telephone Encounter (Signed)
 Copied from CRM [id number]. Topic: Clinical - Medical Advice >> Mar 28, 2024 12:29 PM Miquel SAILOR wrote: Reason for CRM: tirzepatide  (ZEPBOUND ) 5 MG/0.5ML Pen [[id number] requesting upgrade dosage for 7.5 and 10 MG for the medication. Needs call back [phone]  Location:  [facility] #[id number] - [place], [place] - [address] AT [facility] [address] [place]  [address] Phone: [phone] Fax: [phone] Hours: Not open 24 hours

## 2024-03-28 NOTE — Telephone Encounter (Signed)
 Sent 7.5mg  to pharmacy. Need appt in next month.

## 2024-04-20 ENCOUNTER — Other Ambulatory Visit: Payer: Self-pay | Admitting: Physician Assistant

## 2024-04-20 DIAGNOSIS — F900 Attention-deficit hyperactivity disorder, predominantly inattentive type: Secondary | ICD-10-CM

## 2024-04-20 MED ORDER — AMPHETAMINE-DEXTROAMPHET ER 30 MG PO CP24: ORAL_CAPSULE | ORAL | 0 refills | Status: DC

## 2024-04-20 NOTE — Telephone Encounter (Signed)
 Copied from CRM [id number]. Topic: Clinical - Medication Refill >> Apr 20, 2024 10:52 AM Carmell R wrote: Medication: amphetamine -dextroamphetamine  (ADDERALL XR) 30 MG 24 hr capsule  Has the patient contacted their pharmacy? No. But needs new rx. Has no refills  This is the patient's preferred pharmacy:  [facility] #[id number] - [place], [place] - [address] [facility] [address] [place] [address] Phone: [phone] Fax: [phone]  Is this the correct pharmacy for this prescription? Yes  Has the prescription been filled recently? Yes  Is the patient out of the medication? No, has about 5 days left  Has the patient been seen for an appointment in the last year OR does the patient have an upcoming appointment? Yes  Can we respond through MyChart? Yes  Agent: Please be advised that Rx refills may take up to 3 business days. We ask that you follow-up with your pharmacy.

## 2024-05-10 DIAGNOSIS — H43393 Other vitreous opacities, bilateral: Secondary | ICD-10-CM | POA: Diagnosis not present

## 2024-05-11 ENCOUNTER — Other Ambulatory Visit: Payer: Self-pay | Admitting: Physician Assistant

## 2024-05-11 NOTE — Telephone Encounter (Signed)
 Copied from CRM [id number]. Topic: Clinical - Medication Refill >> May 11, 2024  1:44 PM Miquel SAILOR wrote: Medication: tirzepatide  (ZEPBOUND ) 7.5 MG/0.5ML Pen   Has the patient contacted their pharmacy? Yes (Agent: If no, request that the patient contact the pharmacy for the refill. If patient does not wish to contact the pharmacy document the reason why and proceed with request.) (Agent: If yes, when and what did the pharmacy advise?)  This is the patient's preferred pharmacy:  [facility] #[id number] - [place], [place] - [address] [facility] [address] [place] [address] Phone: [phone] Fax: [phone]  Is this the correct pharmacy for this prescription? Yes If no, delete pharmacy and type the correct one.   Has the prescription been filled recently? Yes  Is the patient out of the medication? Yes  Has the patient been seen for an appointment in the last year OR does the patient have an upcoming appointment? Yes  Can we respond through MyChart? Yes  Agent: Please be advised that Rx refills may take up to 3 business days. We ask that you follow-up with your pharmacy.

## 2024-05-15 ENCOUNTER — Other Ambulatory Visit: Payer: Self-pay | Admitting: Physician Assistant

## 2024-05-17 ENCOUNTER — Encounter: Payer: Self-pay | Admitting: Physician Assistant

## 2024-05-17 MED ORDER — ZEPBOUND 7.5 MG/0.5ML ~~LOC~~ SOAJ: 7.5000 mg | SUBCUTANEOUS | 0 refills | Status: DC

## 2024-05-22 ENCOUNTER — Ambulatory Visit: Admitting: Physician Assistant

## 2024-05-22 ENCOUNTER — Encounter: Payer: Self-pay | Admitting: Physician Assistant

## 2024-05-22 VITALS — BP 121/71 | HR 72 | Ht 62.0 in | Wt 123.0 lb

## 2024-05-22 DIAGNOSIS — Z8639 Personal history of other endocrine, nutritional and metabolic disease: Secondary | ICD-10-CM | POA: Insufficient documentation

## 2024-05-22 DIAGNOSIS — F900 Attention-deficit hyperactivity disorder, predominantly inattentive type: Secondary | ICD-10-CM | POA: Diagnosis not present

## 2024-05-22 DIAGNOSIS — E66811 Obesity, class 1: Secondary | ICD-10-CM

## 2024-05-22 DIAGNOSIS — Z79899 Other long term (current) drug therapy: Secondary | ICD-10-CM | POA: Diagnosis not present

## 2024-05-22 MED ORDER — ZEPBOUND 7.5 MG/0.5ML ~~LOC~~ SOAJ: 7.5000 mg | SUBCUTANEOUS | 1 refills | Status: DC

## 2024-05-22 NOTE — Progress Notes (Signed)
   Established Patient Office Visit  Subjective   Patient ID: Heather Sullivan, female    DOB: [birth year]  Age: 63 y.o. MRN: [id number]  Chief Complaint  Patient presents with   Medical Management of Chronic Issues    HPI Pt is a 63 yo female who presents to the clinic for medication follow up. She is doing great with focus and attention on adderall. No concerns. Denies any increase in anxiety or problems sleeping.   She is on zepbound  for weight loss for the last 6 months She has tolerated very well. She is ready to go up to 7.5mg . she would like to get down to 110lbs. She is walking and lifting weights in gym. She has lost 165-123lbs. Pt is paying for this self pay.    ROS See HPI.    Objective:     BP 121/71   Pulse 72   Ht 5' 2 (1.575 m)   Wt 123 lb (55.8 kg)   LMP 04/11/2011   SpO2 99%   BMI 22.50 kg/m  BP Readings from Last 3 Encounters:  05/22/24 121/71  12/28/23 122/80  05/26/23 123/70   Wt Readings from Last 3 Encounters:  05/22/24 123 lb (55.8 kg)  12/28/23 165 lb (74.8 kg)  05/26/23 145 lb (65.8 kg)      Physical Exam Constitutional:      Appearance: Normal appearance.  HENT:     Head: Normocephalic.  Cardiovascular:     Rate and Rhythm: Normal rate and regular rhythm.  Pulmonary:     Effort: Pulmonary effort is normal.     Breath sounds: Normal breath sounds.  Neurological:     General: No focal deficit present.     Mental Status: She is alert.  Psychiatric:        Mood and Affect: Mood normal.       The 10-year ASCVD risk score (Arnett DK, et al., 2019) is: 4.3%    Assessment & Plan:  [name] was seen today for medical management of chronic issues.  Diagnoses and all orders for this visit:  History of obesity -     tirzepatide  (ZEPBOUND ) 7.5 MG/0.5ML Pen; Inject 7.5 mg into the skin once a week.  Medication management  Attention deficit hyperactivity disorder (ADHD), predominantly inattentive type  Other orders -      Discontinue: tirzepatide  (ZEPBOUND ) 7.5 MG/0.5ML Pen; Inject 7.5 mg into the skin once a week.   Increased zepbound  to 7.5mg  to get to goal weight of 110lb Pt is tolerating well with no side effects Lost 40lbs Continue to work on getting adequate protein and keeping muscle Does not need refill of Adderall right now but ok for refill when needed. Labs UTD   Return in about 6 months (around 11/22/2024), or if symptoms worsen or fail to improve.    Jeanie Mccard, PA-C

## 2024-07-19 ENCOUNTER — Other Ambulatory Visit: Payer: Self-pay | Admitting: Physician Assistant

## 2024-07-19 DIAGNOSIS — F900 Attention-deficit hyperactivity disorder, predominantly inattentive type: Secondary | ICD-10-CM

## 2024-07-19 MED ORDER — AMPHETAMINE-DEXTROAMPHET ER 30 MG PO CP24: ORAL_CAPSULE | ORAL | 0 refills | Status: DC

## 2024-07-19 NOTE — Telephone Encounter (Signed)
 Last filled: 06/19/24 Last OV: 05/22/24 No future appointment scheduled

## 2024-07-19 NOTE — Telephone Encounter (Signed)
 Copied from CRM [id number]. Topic: Clinical - Medication Refill >> Jul 19, 2024 10:32 AM Delon T wrote: Medication: amphetamine -dextroamphetamine  (ADDERALL XR) 30 MG 24 hr capsule- 90 day supply   Has the patient contacted their pharmacy? No (Agent: If no, request that the patient contact the pharmacy for the refill. If patient does not wish to contact the pharmacy document the reason why and proceed with request.) (Agent: If yes, when and what did the pharmacy advise?)  This is the patient's preferred pharmacy:  [facility] #[id number] - [place], [place] - [address] AT [facility] [address] & [address] [place] [address] Phone: [phone] Fax: [phone]  Is this the correct pharmacy for this prescription? Yes If no, delete pharmacy and type the correct one.   Has the prescription been filled recently? Yes  Is the patient out of the medication? No  Has the patient been seen for an appointment in the last year OR does the patient have an upcoming appointment? Yes  Can we respond through MyChart? Yes  Agent: Please be advised that Rx refills may take up to 3 business days. We ask that you follow-up with your pharmacy.

## 2024-10-09 ENCOUNTER — Ambulatory Visit: Admitting: Physician Assistant

## 2024-10-09 ENCOUNTER — Encounter: Payer: Self-pay | Admitting: Physician Assistant

## 2024-10-09 VITALS — BP 137/79 | HR 67 | Ht 62.0 in | Wt 112.0 lb

## 2024-10-09 DIAGNOSIS — Z79899 Other long term (current) drug therapy: Secondary | ICD-10-CM | POA: Diagnosis not present

## 2024-10-09 DIAGNOSIS — F900 Attention-deficit hyperactivity disorder, predominantly inattentive type: Secondary | ICD-10-CM | POA: Diagnosis not present

## 2024-10-09 DIAGNOSIS — H6123 Impacted cerumen, bilateral: Secondary | ICD-10-CM | POA: Insufficient documentation

## 2024-10-09 DIAGNOSIS — Z8639 Personal history of other endocrine, nutritional and metabolic disease: Secondary | ICD-10-CM

## 2024-10-09 DIAGNOSIS — E66811 Obesity, class 1: Secondary | ICD-10-CM

## 2024-10-09 MED ORDER — ZEPBOUND 7.5 MG/0.5ML ~~LOC~~ SOAJ: 7.5000 mg | SUBCUTANEOUS | 1 refills | Status: AC

## 2024-10-09 MED ORDER — AMPHETAMINE-DEXTROAMPHET ER 30 MG PO CP24: ORAL_CAPSULE | ORAL | 0 refills | Status: AC

## 2024-10-09 NOTE — Progress Notes (Signed)
 Meds ordered this encounter  Medications   tirzepatide  (ZEPBOUND ) 7.5 MG/0.5ML Pen    Sig: Inject 7.5 mg into the skin once a week.    Dispense:  6 mL    Refill:  1    ICD-9 code: E66.811    Supervising Provider:   Khalik Pewitt D [[id number]]   amphetamine -dextroamphetamine  (ADDERALL XR) 30 MG 24 hr capsule    Sig: TAKE 1 CAPSULE(30 MG) BY MOUTH EVERY MORNING    Dispense:  90 capsule    Refill:  0

## 2024-10-09 NOTE — Addendum Note (Signed)
 Addended by: Imya Mance D on: 10/09/2024 12:59 PM   Modules accepted: Orders

## 2024-10-09 NOTE — Progress Notes (Signed)
 "  Established Patient Office Visit  Subjective   Patient ID: ILENE WITCHER, female    DOB: 05/22/1961  Age: 64 y.o. MRN: [id number]  Chief Complaint  Patient presents with   Medical Management of Chronic Issues    HPI Discussed the use of AI scribe software for clinical note transcription with the patient, who gave verbal consent to proceed.  History of Present Illness FRANCIS YARDLEY is a 64 year old female who presents for medication refills and follow-up.  Right ear discomfort - Sensation of fullness in the right ear for the past two weeks, described as similar to having water in the ear - No associated pain - Sensation is more noticeable in the morning and subsides during the day - No fevers, chills, or sinus symptoms - No use of nasal sprays or other treatments - History of cerumen impaction with need for irrigation  Obesity management - Currently taking Zepbound  7.5 mg for weight loss - Has lost 11 pounds since August - Spacing doses to every ten days due to prescription availability and cost - Pays out-of-pocket for medication and is seeking more affordable options - Engages in weightlifting and walking for exercise - Maintains a diet of 1000-1200 calories per day  Attention deficit hyperactivity disorder (adhd) - Requires refill of Adderall XR 30 mg daily - Stable on current regimen with good focus - No side effects.      ROS See HPI.    Objective:     BP 137/79   Pulse 67   Ht 5' 2 (1.575 m)   Wt 112 lb (50.8 kg)   LMP 04/11/2011   SpO2 99%   BMI 20.49 kg/m  BP Readings from Last 3 Encounters:  10/09/24 137/79  05/22/24 121/71  12/28/23 122/80   Wt Readings from Last 3 Encounters:  10/09/24 112 lb (50.8 kg)  05/22/24 123 lb (55.8 kg)  12/28/23 165 lb (74.8 kg)    .Cerumen Removal Template: Indication: Cerumen impaction of the ear(s) Medical necessity statement: On physical examination, cerumen impairs clinically significant portions of the  external auditory canal, and tympanic membrane. Noted obstructive, copious cerumen that cannot be removed without magnification and instrumentations requiring physician skills Consent: Discussed benefits and risks of procedure and verbal consent obtained Procedure: Patient was prepped for the procedure. Utilized an otoscope to assess and take note of the ear canal, the tympanic membrane, and the presence, amount, and placement of the cerumen. Gentle water irrigation and soft plastic curette was utilized to remove cerumen.  Post procedure examination shows cerumen was completely removed. Patient tolerated procedure well. The patient is made aware that they may experience temporary vertigo, temporary hearing loss, and temporary discomfort. If these symptom last for more than 24 hours to call the clinic or proceed to the ED.   Physical Exam Constitutional:      Appearance: Normal appearance.  HENT:     Head: Normocephalic.     Right Ear: There is impacted cerumen.     Left Ear: There is impacted cerumen.     Mouth/Throat:     Mouth: Mucous membranes are moist.     Pharynx: No oropharyngeal exudate or posterior oropharyngeal erythema.  Eyes:     General:        Right eye: No discharge.        Left eye: No discharge.     Pupils: Pupils are equal, round, and reactive to light.  Cardiovascular:     Rate and Rhythm: Normal  rate and regular rhythm.  Pulmonary:     Effort: Pulmonary effort is normal.     Breath sounds: Normal breath sounds.  Musculoskeletal:     Cervical back: No tenderness.     Right lower leg: No edema.     Left lower leg: No edema.  Lymphadenopathy:     Cervical: No cervical adenopathy.  Neurological:     General: No focal deficit present.     Mental Status: She is alert and oriented to person, place, and time.  Psychiatric:        Mood and Affect: Mood normal.        The 10-year ASCVD risk score (Arnett DK, et al., 2019) is: 5.5%    Assessment & Plan:   SABRASABRAVaneza was seen today for medical management of chronic issues.  Diagnoses and all orders for this visit:  Attention deficit hyperactivity disorder (ADHD), predominantly inattentive type  Medication management  History of obesity -     tirzepatide  (ZEPBOUND ) 7.5 MG/0.5ML Pen; Inject 7.5 mg into the skin once a week.  Bilateral impacted cerumen   Assessment & Plan Attention deficit hyperactivity disorder, predominantly inattentive type ADHD is well-managed with Adderall XR 30 mg daily, with no reported issues. - Continue Adderall XR 30 mg daily. ..PDMP reviewed during this encounter. No concerns. Last 90 day refill was picked up 07/19/24.  - [facility]  History of obesity Obesity is managed with Zepbound  7.5 mg subcutaneous once a week. She has lost 11 pounds since August, with a current BMI of 20.5. She is tolerating the medication well and prefers to maintain the current dose due to cost considerations. - Continue Zepbound  7.5 mg subcutaneous once a week but patient is stretching to 10-11 days to help with cost.  - Encouraged regular exercise, including walking and weightlifting. - Advised maintaining a daily caloric intake of 1000-1200 calories. - Discussed potential cost-saving options for Zepbound , including checking with other pharmacies for lower prices.  Cerumen impaction, bilaterally Reports right ear fullness for two weeks, similar to previous episodes of eustachian tube dysfunction. - Cleaned ears to remove cerumen with irrigation, patient tolerated procedure well.  - Ok to use flonase as needed for ETD.      Laurynn Mccorvey, PA-C  "

## 2024-10-09 NOTE — Patient Instructions (Signed)
 Continue zepbound  7.5mg  and Adderall XR 30mg .

## 2024-10-18 LAB — HM MAMMOGRAPHY

## 2024-10-19 ENCOUNTER — Encounter: Payer: Self-pay | Admitting: Physician Assistant
